# Patient Record
Sex: Male | Born: 1980 | Race: Black or African American | Hispanic: No | Marital: Single | Smoking: Former smoker
Health system: Southern US, Community
[De-identification: ages and names within clinical notes are randomized; demographics above are authoritative.]

## PROBLEM LIST (undated history)

## (undated) DIAGNOSIS — R739 Hyperglycemia, unspecified: Secondary | ICD-10-CM

## (undated) DIAGNOSIS — R079 Chest pain, unspecified: Secondary | ICD-10-CM

## (undated) DIAGNOSIS — E119 Type 2 diabetes mellitus without complications: Secondary | ICD-10-CM

## (undated) DIAGNOSIS — I10 Essential (primary) hypertension: Secondary | ICD-10-CM

## (undated) DIAGNOSIS — E785 Hyperlipidemia, unspecified: Secondary | ICD-10-CM

## (undated) HISTORY — DX: Hyperlipidemia, unspecified: E78.5

## (undated) HISTORY — DX: Chest pain, unspecified: R07.9

## (undated) HISTORY — DX: Essential (primary) hypertension: I10

## (undated) HISTORY — DX: Type 2 diabetes mellitus without complications: E11.9

## (undated) HISTORY — DX: Hyperglycemia, unspecified: R73.9

---

## 2019-09-08 ENCOUNTER — Encounter (INDEPENDENT_AMBULATORY_CARE_PROVIDER_SITE_OTHER): Payer: Self-pay | Admitting: Primary Care

## 2019-09-08 ENCOUNTER — Ambulatory Visit (INDEPENDENT_AMBULATORY_CARE_PROVIDER_SITE_OTHER): Payer: Self-pay | Admitting: Primary Care

## 2019-09-08 ENCOUNTER — Other Ambulatory Visit: Payer: Self-pay

## 2019-09-08 DIAGNOSIS — M79645 Pain in left finger(s): Secondary | ICD-10-CM

## 2019-09-08 DIAGNOSIS — Z720 Tobacco use: Secondary | ICD-10-CM

## 2019-09-08 DIAGNOSIS — G4733 Obstructive sleep apnea (adult) (pediatric): Secondary | ICD-10-CM

## 2019-09-08 DIAGNOSIS — Z7689 Persons encountering health services in other specified circumstances: Secondary | ICD-10-CM

## 2019-09-08 NOTE — Progress Notes (Signed)
Virtual Visit via Telephone Note  I connected with Tramane Gorum on 09/08/19 at  9:10 AM EDT by telephone and verified that I am speaking with the correct person using two identifiers.   I discussed the limitations, risks, security and privacy concerns of performing an evaluation and management service by telephone and the availability of in person appointments. I also discussed with the patient that there may be a patient responsible charge related to this service. The patient expressed understanding and agreed to proceed.   History of Present Illness: Mr. Steve Nelson is a tele visit to establish care. He complains of snoring and stop breathing during his sleep girl friend has to bulge him. Also, has pain in    No Past Medical History on File Observations/Objective: Review of Systems  Respiratory: Positive for shortness of breath.   Musculoskeletal:       Hand pain  Neurological: Positive for dizziness, tingling and headaches.    Assessment and Plan: Jovin was seen today for new patient (initial visit), snoring and dizziness.  Diagnoses and all orders for this visit:  Encounter to establish care Juluis Mire, NP-C will be your  (PCP)  Is a mastered prepared clincian that will provides health care and  continuing care of various  medical conditions, not limited by cause, organ system, or diagnosis.   OSA (obstructive sleep apnea) Patient states he has obstructive sleep apnea.  Patient generally gets 4-6 hours of interrupted sleep per night, and states they generally have poor sleep quality. Snoring of moderate  severity is present. Apneic episodes per girl friend  Is present.   Tobacco abuse Patient is made aware of each visit we will discuss cessation  Smoking increase risk for respiratory problems or lung disease.  Left index finger pain Denies injury or trauma will evaluate on in person exam to include labs for arthritis, carpal tunnel or trigger finger.      Follow Up Instructions:    I discussed the assessment and treatment plan with the patient. The patient was provided an opportunity to ask questions and all were answered. The patient agreed with the plan and demonstrated an understanding of the instructions.   The patient was advised to call back or seek an in-person evaluation if the symptoms worsen or if the condition fails to improve as anticipated.  I provided  minutes of  16 non-face-to-face time during this encounter.   Kerin Perna, NP

## 2019-09-13 ENCOUNTER — Other Ambulatory Visit: Payer: Self-pay

## 2019-09-13 ENCOUNTER — Ambulatory Visit (INDEPENDENT_AMBULATORY_CARE_PROVIDER_SITE_OTHER): Payer: Self-pay | Admitting: Primary Care

## 2019-09-13 ENCOUNTER — Encounter (INDEPENDENT_AMBULATORY_CARE_PROVIDER_SITE_OTHER): Payer: Self-pay | Admitting: Primary Care

## 2019-09-13 VITALS — BP 136/94 | HR 108 | Temp 98.6°F | Ht 66.0 in | Wt 210.4 lb

## 2019-09-13 DIAGNOSIS — Z0000 Encounter for general adult medical examination without abnormal findings: Secondary | ICD-10-CM

## 2019-09-13 DIAGNOSIS — R05 Cough: Secondary | ICD-10-CM

## 2019-09-13 DIAGNOSIS — Z131 Encounter for screening for diabetes mellitus: Secondary | ICD-10-CM

## 2019-09-13 DIAGNOSIS — Z23 Encounter for immunization: Secondary | ICD-10-CM

## 2019-09-13 DIAGNOSIS — G4733 Obstructive sleep apnea (adult) (pediatric): Secondary | ICD-10-CM

## 2019-09-13 DIAGNOSIS — R5383 Other fatigue: Secondary | ICD-10-CM

## 2019-09-13 DIAGNOSIS — M79645 Pain in left finger(s): Secondary | ICD-10-CM

## 2019-09-13 DIAGNOSIS — Z720 Tobacco use: Secondary | ICD-10-CM

## 2019-09-13 DIAGNOSIS — R030 Elevated blood-pressure reading, without diagnosis of hypertension: Secondary | ICD-10-CM

## 2019-09-13 LAB — POCT GLYCOSYLATED HEMOGLOBIN (HGB A1C): Hemoglobin A1C: 5.9 % — AB (ref 4.0–5.6)

## 2019-09-13 NOTE — Progress Notes (Signed)
slee  Established Patient Office Visit  Subjective:  Patient ID: Steve Nelson, male    DOB: 01/01/1981  Age: 38 y.o. MRN: [id number]  CC:  Chief Complaint  Patient presents with  . Annual Exam    HPI Steve Nelson presents for physical exam. Today blood pressure is slightly elevated. Also, before his appointment he smoked a cigarette. Complains of feeling tired sometimes this maybe related to working all the time but will rule out thyroid disease. Patient complains of left index finger continuing to hurt- he jammed it at work. States previously swollen, now just painful  No past medical history on file.  No past surgical history on file.  Family History  Problem Relation Age of Onset  . Hypertension Mother   . Diabetes Mother     Social History   Socioeconomic History  . Marital status: Single    Spouse name: Not on file  . Number of children: Not on file  . Years of education: Not on file  . Highest education level: Not on file  Occupational History  . Not on file  Social Needs  . Financial resource strain: Not on file  . Food insecurity    Worry: Not on file    Inability: Not on file  . Transportation needs    Medical: Not on file    Non-medical: Not on file  Tobacco Use  . Smoking status: Current Every Day Smoker    Types: Cigarettes  . Smokeless tobacco: Never Used  Substance and Sexual Activity  . Alcohol use: Not Currently  . Drug use: Never  . Sexual activity: Yes    Partners: Female    Birth control/protection: None  Lifestyle  . Physical activity    Days per week: Not on file    Minutes per session: Not on file  . Stress: Not on file  Relationships  . Social Herbalist on phone: Not on file    Gets together: Not on file    Attends religious service: Not on file    Active member of club or organization: Not on file    Attends meetings of clubs or organizations: Not on file    Relationship status: Not on file  . Intimate partner  violence    Fear of current or ex partner: Not on file    Emotionally abused: Not on file    Physically abused: Not on file    Forced sexual activity: Not on file  Other Topics Concern  . Not on file  Social History Narrative  . Not on file    No outpatient medications prior to visit.   No facility-administered medications prior to visit.     No Known Allergies  ROS Review of Systems  Constitutional: Positive for fatigue.  Respiratory: Positive for cough.        Smoker cough snoring  All other systems reviewed and are negative.     Objective:    Physical Exam  Constitutional: He is oriented to person, place, and time. He appears well-developed and well-nourished.  HENT:  Head: Normocephalic.  Eyes: Pupils are equal, round, and reactive to light. EOM are normal.  Neck: Normal range of motion. Neck supple.  Cardiovascular: Normal rate and regular rhythm.  Pulmonary/Chest: Effort normal and breath sounds normal.  Abdominal: Soft. Bowel sounds are normal. He exhibits distension.  Musculoskeletal: Normal range of motion.  Neurological: He is oriented to person, place, and time.  Skin: Skin is warm and dry.  Psychiatric: He has a normal mood and affect.    BP (!) 136/94 (BP Location: Left Arm, Patient Position: Sitting, Cuff Size: Large)   Pulse (!) 108   Temp 98.6 F (37 C) (Temporal)   Ht 5' 6"  (1.676 m)   Wt 210 lb 6.4 oz (95.4 kg)   SpO2 96%   BMI 33.96 kg/m  Wt Readings from Last 3 Encounters:  09/13/19 210 lb 6.4 oz (95.4 kg)     Health Maintenance Due  Topic Date Due  . HIV Screening  06/06/1995  . TETANUS/TDAP  06/06/1999   Steve Nelson was seen today for annual exam.  Diagnoses and all orders for this visit:  Annual physical exam -     Cancel: CBC with Differential -     Cancel: CMP14+EGFR -     Cancel: Lipid Panel -     Cancel: HIV Antibody (routine testing w rflx) -     CBC with Differential; Future -     CMP14+EGFR; Future -     HIV Antibody  (routine testing w rflx); Future -     Lipid Panel; Future  Tobacco abuse Patient is aware the risk of cigarette smoking but not at the point of stopping but at each encounter we will discuss cessation.  Elevated blood pressure reading without diagnosis of hypertension Target goal is  Less than 130/80. To achieve this goal discussed weight loss with 150 hours of exercising weekly , diet modification reduction in salty foods and snacks alternate fruit and vegetable and a healthy diet. Season foods with herbs or garlic or onion powder, and or Ms Deliah Boston   Fatigue, unspecified type-    Rule out underlying causative factor thyroid disease and anemia.   TSH + free T4; Future  OSA (obstructive sleep apnea)Patient presents with possible obstructive sleep apnea. Patent has a history of symptoms of OSA. Patient generally gets 4-6 hours of sleep per night, and states they generally have poor quality . Snoring of moderate severity ispresent. Apneic episodes unclear present.     Follow-up: No follow-ups on file.    Kerin Perna, NP

## 2019-09-13 NOTE — Patient Instructions (Signed)

## 2019-09-13 NOTE — Progress Notes (Signed)
Pt feels like something is in his ear  Pt complains of OSA and finger pain In left index finger

## 2019-10-18 ENCOUNTER — Other Ambulatory Visit: Payer: Self-pay

## 2019-10-18 ENCOUNTER — Ambulatory Visit (INDEPENDENT_AMBULATORY_CARE_PROVIDER_SITE_OTHER): Payer: Self-pay | Admitting: Primary Care

## 2019-10-18 ENCOUNTER — Encounter (INDEPENDENT_AMBULATORY_CARE_PROVIDER_SITE_OTHER): Payer: Self-pay | Admitting: Primary Care

## 2019-10-18 VITALS — BP 136/88 | HR 76 | Temp 97.3°F | Ht 66.0 in | Wt 210.4 lb

## 2019-10-18 DIAGNOSIS — E669 Obesity, unspecified: Secondary | ICD-10-CM

## 2019-10-18 DIAGNOSIS — Z6833 Body mass index (BMI) 33.0-33.9, adult: Secondary | ICD-10-CM

## 2019-10-18 DIAGNOSIS — Z0000 Encounter for general adult medical examination without abnormal findings: Secondary | ICD-10-CM

## 2019-10-18 DIAGNOSIS — Z720 Tobacco use: Secondary | ICD-10-CM

## 2019-10-18 DIAGNOSIS — R5383 Other fatigue: Secondary | ICD-10-CM

## 2019-10-18 DIAGNOSIS — I10 Essential (primary) hypertension: Secondary | ICD-10-CM

## 2019-10-18 MED ORDER — AMLODIPINE BESYLATE 5 MG PO TABS: 5.0000 mg | ORAL_TABLET | Freq: Every day | ORAL | 3 refills | Status: DC

## 2019-10-18 NOTE — Progress Notes (Signed)
Established Patient Office Visit  Subjective:  Patient ID: Steve Nelson, male    DOB: 04-24-1981  Age: 38 y.o. MRN: [id number]  CC:  Chief Complaint  Patient presents with  . Blood Pressure Check    HPI Steve Nelson presents for blood pressure re-evaluate to determine if medication is needed. Bp does follow guidelines for diagnosis of HTN. He denies shortness of breath, headaches, chest pain or lower extremity edema.  History reviewed. No pertinent past medical history.  History reviewed. No pertinent surgical history.  Family History  Problem Relation Age of Onset  . Hypertension Mother   . Diabetes Mother     Social History   Socioeconomic History  . Marital status: Single    Spouse name: Not on file  . Number of children: Not on file  . Years of education: Not on file  . Highest education level: Not on file  Occupational History  . Not on file  Social Needs  . Financial resource strain: Not on file  . Food insecurity    Worry: Not on file    Inability: Not on file  . Transportation needs    Medical: Not on file    Non-medical: Not on file  Tobacco Use  . Smoking status: Current Every Day Smoker    Types: Cigarettes  . Smokeless tobacco: Never Used  Substance and Sexual Activity  . Alcohol use: Not Currently  . Drug use: Never  . Sexual activity: Yes    Partners: Female    Birth control/protection: None  Lifestyle  . Physical activity    Days per week: Not on file    Minutes per session: Not on file  . Stress: Not on file  Relationships  . Social Musician on phone: Not on file    Gets together: Not on file    Attends religious service: Not on file    Active member of club or organization: Not on file    Attends meetings of clubs or organizations: Not on file    Relationship status: Not on file  . Intimate partner violence    Fear of current or ex partner: Not on file    Emotionally abused: Not on file    Physically abused:  Not on file    Forced sexual activity: Not on file  Other Topics Concern  . Not on file  Social History Narrative  . Not on file    No outpatient medications prior to visit.   No facility-administered medications prior to visit.     No Known Allergies  ROS Review of Systems  All other systems reviewed and are negative.     Objective:    Physical Exam  Constitutional: He is oriented to person, place, and time. He appears well-developed and well-nourished.  HENT:  Head: Normocephalic.  Neck: Neck supple.  Cardiovascular: Normal rate and regular rhythm.  Abdominal: Soft. Bowel sounds are normal. He exhibits distension.  Musculoskeletal: Normal range of motion.  Neurological: He is oriented to person, place, and time.  Psychiatric: He has a normal mood and affect. His behavior is normal.    BP 136/88 (BP Location: Left Arm, Patient Position: Sitting, Cuff Size: Large)   Pulse 76   Temp (!) 97.3 F (36.3 C) (Temporal)   Ht 5\' 6"  (1.676 m)   Wt 210 lb 6.4 oz (95.4 kg)   SpO2 96%   BMI 33.96 kg/m  Wt Readings from Last 3 Encounters:  10/18/19 210 lb  6.4 oz (95.4 kg)  09/13/19 210 lb 6.4 oz (95.4 kg)     There are no preventive care reminders to display for this patient.  There are no preventive care reminders to display for this patient.  Lab Results  Component Value Date   TSH 1.230 10/18/2019   Lab Results  Component Value Date   WBC 4.9 10/18/2019   HGB 15.3 10/18/2019   HCT 45.1 10/18/2019   MCV 85 10/18/2019   PLT 373 10/18/2019   Lab Results  Component Value Date   NA 138 10/18/2019   K 5.0 10/18/2019   CO2 21 10/18/2019   GLUCOSE 101 (H) 10/18/2019   BUN 13 10/18/2019   CREATININE 0.95 10/18/2019   BILITOT 0.3 10/18/2019   ALKPHOS 45 10/18/2019   AST 17 10/18/2019   ALT 19 10/18/2019   PROT 7.6 10/18/2019   ALBUMIN 4.8 10/18/2019   CALCIUM 9.8 10/18/2019   Lab Results  Component Value Date   CHOL 194 10/18/2019   Lab Results   Component Value Date   HDL 48 10/18/2019   Lab Results  Component Value Date   LDLCALC 137 (H) 10/18/2019   Lab Results  Component Value Date   TRIG 47 10/18/2019   Lab Results  Component Value Date   CHOLHDL 4.0 10/18/2019   Lab Results  Component Value Date   HGBA1C 5.9 (A) 09/13/2019      Assessment & Plan:  Steve Nelson was seen today for blood pressure check.  Diagnoses and all orders for this visit:  Tobacco abuse Discuss and patient understands nicotine affect every organ in the body , increased risk for lung cancer and other respiratory diseases recommend cessation.  This will be reminded at each clinical visit.  Essential hypertension New  Diagnosis of hypertension requiring Bp medication for controlled. Counseled on blood pressure goal of less than 130/80, low-sodium, DASH diet, medication compliance, 150 minutes of moderate intensity exercise per week. Discussed medication compliance, adverse effects.  BMI 33.0-33.9,adult Obesity is 30-39 indicating an excess in caloric intake or underlining conditions. This may lead to CVD, risk of diabetes, and tobacco abuse risk of heart attack. Lifestyle modifications of diet and exercise may reduce obesity.   Meds ordered this encounter  Medications  . amLODipine (NORVASC) 5 MG tablet    Sig: Take 1 tablet (5 mg total) by mouth daily.    Dispense:  30 tablet    Refill:  3    Follow-up: Return in about 4 weeks (around 11/15/2019) for Bp reading tele (4-6) weeks.    Steve Perna, NP

## 2019-10-18 NOTE — Patient Instructions (Signed)
Managing Your Hypertension Hypertension is commonly called high blood pressure. This is when the force of your blood pressing against the walls of your arteries is too strong. Arteries are blood vessels that carry blood from your heart throughout your body. Hypertension forces the heart to work harder to pump blood, and may cause the arteries to become narrow or stiff. Having untreated or uncontrolled hypertension can cause heart attack, stroke, kidney disease, and other problems. What are blood pressure readings? A blood pressure reading consists of a higher number over a lower number. Ideally, your blood pressure should be below 120/80. The first ("top") number is called the systolic pressure. It is a measure of the pressure in your arteries as your heart beats. The second ("bottom") number is called the diastolic pressure. It is a measure of the pressure in your arteries as the heart relaxes. What does my blood pressure reading mean? Blood pressure is classified into four stages. Based on your blood pressure reading, your health care provider may use the following stages to determine what type of treatment you need, if any. Systolic pressure and diastolic pressure are measured in a unit called mm Hg. Normal  Systolic pressure: below 120.  Diastolic pressure: below 80. Elevated  Systolic pressure: 120-129.  Diastolic pressure: below 80. Hypertension stage 1  Systolic pressure: 130-139.  Diastolic pressure: 80-89. Hypertension stage 2  Systolic pressure: 140 or above.  Diastolic pressure: 90 or above. What health risks are associated with hypertension? Managing your hypertension is an important responsibility. Uncontrolled hypertension can lead to:  A heart attack.  A stroke.  A weakened blood vessel (aneurysm).  Heart failure.  Kidney damage.  Eye damage.  Metabolic syndrome.  Memory and concentration problems. What changes can I make to manage my  hypertension? Hypertension can be managed by making lifestyle changes and possibly by taking medicines. Your health care provider will help you make a plan to bring your blood pressure within a normal range. Eating and drinking   Eat a diet that is high in fiber and potassium, and low in salt (sodium), added sugar, and fat. An example eating plan is called the DASH (Dietary Approaches to Stop Hypertension) diet. To eat this way: ? Eat plenty of fresh fruits and vegetables. Try to fill half of your plate at each meal with fruits and vegetables. ? Eat whole grains, such as whole wheat pasta, brown rice, or whole grain bread. Fill about one quarter of your plate with whole grains. ? Eat low-fat diary products. ? Avoid fatty cuts of meat, processed or cured meats, and poultry with skin. Fill about one quarter of your plate with lean proteins such as fish, chicken without skin, beans, eggs, and tofu. ? Avoid premade and processed foods. These tend to be higher in sodium, added sugar, and fat.  Reduce your daily sodium intake. Most people with hypertension should eat less than 1,500 mg of sodium a day.  Limit alcohol intake to no more than 1 drink a day for nonpregnant women and 2 drinks a day for men. One drink equals 12 oz of beer, 5 oz of wine, or 1 oz of hard liquor. Lifestyle  Work with your health care provider to maintain a healthy body weight, or to lose weight. Ask what an ideal weight is for you.  Get at least 30 minutes of exercise that causes your heart to beat faster (aerobic exercise) most days of the week. Activities may include walking, swimming, or biking.  Include exercise   to strengthen your muscles (resistance exercise), such as weight lifting, as part of your weekly exercise routine. Try to do these types of exercises for 30 minutes at least 3 days a week.  Do not use any products that contain nicotine or tobacco, such as cigarettes and e-cigarettes. If you need help quitting,  ask your health care provider.  Control any long-term (chronic) conditions you have, such as high cholesterol or diabetes. Monitoring  Monitor your blood pressure at home as told by your health care provider. Your personal target blood pressure may vary depending on your medical conditions, your age, and other factors.  Have your blood pressure checked regularly, as often as told by your health care provider. Working with your health care provider  Review all the medicines you take with your health care provider because there may be side effects or interactions.  Talk with your health care provider about your diet, exercise habits, and other lifestyle factors that may be contributing to hypertension.  Visit your health care provider regularly. Your health care provider can help you create and adjust your plan for managing hypertension. Will I need medicine to control my blood pressure? Your health care provider may prescribe medicine if lifestyle changes are not enough to get your blood pressure under control, and if:  Your systolic blood pressure is 130 or higher.  Your diastolic blood pressure is 80 or higher. Take medicines only as told by your health care provider. Follow the directions carefully. Blood pressure medicines must be taken as prescribed. The medicine does not work as well when you skip doses. Skipping doses also puts you at risk for problems. Contact a health care provider if:  You think you are having a reaction to medicines you have taken.  You have repeated (recurrent) headaches.  You feel dizzy.  You have swelling in your ankles.  You have trouble with your vision. Get help right away if:  You develop a severe headache or confusion.  You have unusual weakness or numbness, or you feel faint.  You have severe pain in your chest or abdomen.  You vomit repeatedly.  You have trouble breathing. Summary  Hypertension is when the force of blood pumping  through your arteries is too strong. If this condition is not controlled, it may put you at risk for serious complications.  Your personal target blood pressure may vary depending on your medical conditions, your age, and other factors. For most people, a normal blood pressure is less than 120/80.  Hypertension is managed by lifestyle changes, medicines, or both. Lifestyle changes include weight loss, eating a healthy, low-sodium diet, exercising more, and limiting alcohol. This information is not intended to replace advice given to you by your health care provider. Make sure you discuss any questions you have with your health care provider. Document Released: 08/05/2012 Document Revised: 03/05/2019 Document Reviewed: 10/09/2016 Elsevier Patient Education  2020 [employer]  

## 2019-10-19 LAB — CBC WITH DIFFERENTIAL/PLATELET
Basophils Absolute: 0.1 10*3/uL (ref 0.0–0.2)
Basos: 1 %
EOS (ABSOLUTE): 0.1 10*3/uL (ref 0.0–0.4)
Eos: 1 %
Hematocrit: 45.1 % (ref 37.5–51.0)
Hemoglobin: 15.3 g/dL (ref 13.0–17.7)
Immature Grans (Abs): 0 10*3/uL (ref 0.0–0.1)
Immature Granulocytes: 0 %
Lymphocytes Absolute: 1.4 10*3/uL (ref 0.7–3.1)
Lymphs: 28 %
MCH: 28.9 pg (ref 26.6–33.0)
MCHC: 33.9 g/dL (ref 31.5–35.7)
MCV: 85 fL (ref 79–97)
Monocytes Absolute: 0.4 10*3/uL (ref 0.1–0.9)
Monocytes: 9 %
Neutrophils Absolute: 3 10*3/uL (ref 1.4–7.0)
Neutrophils: 61 %
Platelets: 373 10*3/uL (ref 150–450)
RBC: 5.3 x10E6/uL (ref 4.14–5.80)
RDW: 13 % (ref 11.6–15.4)
WBC: 4.9 10*3/uL (ref 3.4–10.8)

## 2019-10-19 LAB — CMP14+EGFR
ALT: 19 IU/L (ref 0–44)
AST: 17 IU/L (ref 0–40)
Albumin/Globulin Ratio: 1.7 (ref 1.2–2.2)
Albumin: 4.8 g/dL (ref 4.0–5.0)
Alkaline Phosphatase: 45 IU/L (ref 39–117)
BUN/Creatinine Ratio: 14 (ref 9–20)
BUN: 13 mg/dL (ref 6–20)
Bilirubin Total: 0.3 mg/dL (ref 0.0–1.2)
CO2: 21 mmol/L (ref 20–29)
Calcium: 9.8 mg/dL (ref 8.7–10.2)
Chloride: 102 mmol/L (ref 96–106)
Creatinine, Ser: 0.95 mg/dL (ref 0.76–1.27)
GFR calc Af Amer: 116 mL/min/{1.73_m2} (ref >59)
GFR calc non Af Amer: 100 mL/min/{1.73_m2} (ref >59)
Globulin, Total: 2.8 g/dL (ref 1.5–4.5)
Glucose: 101 mg/dL — ABNORMAL HIGH (ref 65–99)
Potassium: 5 mmol/L (ref 3.5–5.2)
Sodium: 138 mmol/L (ref 134–144)
Total Protein: 7.6 g/dL (ref 6.0–8.5)

## 2019-10-19 LAB — LIPID PANEL
Chol/HDL Ratio: 4 ratio (ref 0.0–5.0)
Cholesterol, Total: 194 mg/dL (ref 100–199)
HDL: 48 mg/dL (ref >39)
LDL Chol Calc (NIH): 137 mg/dL — ABNORMAL HIGH (ref 0–99)
Triglycerides: 47 mg/dL (ref 0–149)
VLDL Cholesterol Cal: 9 mg/dL (ref 5–40)

## 2019-10-19 LAB — HIV ANTIBODY (ROUTINE TESTING W REFLEX): HIV Screen 4th Generation wRfx: NONREACTIVE

## 2019-10-19 LAB — TSH+FREE T4
Free T4: 1.01 ng/dL (ref 0.82–1.77)
TSH: 1.23 u[IU]/mL (ref 0.450–4.500)

## 2019-10-26 ENCOUNTER — Telehealth (INDEPENDENT_AMBULATORY_CARE_PROVIDER_SITE_OTHER): Payer: Self-pay

## 2019-10-26 NOTE — Telephone Encounter (Signed)
Patient verified date of birth. He is aware that all labs are normal except bad cholesterol, which can lead to stroke or heart attack. Advised patient to lower number he should decrease his fatty foods, red meat, cheese and milk. Increase fiber with whole grains and veggies or a fiber supplement such as Benefiber or Metamucil. Patient expressed understanding of results. Nat Christen, CMA

## 2019-10-26 NOTE — Telephone Encounter (Signed)
-----   Message from Kerin Perna, NP sent at 10/25/2019 10:54 AM EST ----- Labs normal except HDL-bad cholesterol that can lead to heart attack and stroke. To lower your number you can decrease your fatty foods, red meat, cheese, milk and increase fiber like whole grains and veggies. You can also add a fiber supplement like Metamucil or Benefiber.

## 2019-11-16 ENCOUNTER — Ambulatory Visit (INDEPENDENT_AMBULATORY_CARE_PROVIDER_SITE_OTHER): Payer: Self-pay | Admitting: Primary Care

## 2020-11-18 ENCOUNTER — Emergency Department (HOSPITAL_COMMUNITY): Payer: Self-pay

## 2020-11-18 ENCOUNTER — Emergency Department (HOSPITAL_COMMUNITY)
Admission: EM | Admit: 2020-11-18 | Discharge: 2020-11-18 | Disposition: A | Discharge status: Home / Self Care | Payer: Self-pay | Attending: Emergency Medicine | Admitting: Emergency Medicine

## 2020-11-18 DIAGNOSIS — I10 Essential (primary) hypertension: Secondary | ICD-10-CM | POA: Insufficient documentation

## 2020-11-18 DIAGNOSIS — R519 Headache, unspecified: Secondary | ICD-10-CM

## 2020-11-18 DIAGNOSIS — F17210 Nicotine dependence, cigarettes, uncomplicated: Secondary | ICD-10-CM | POA: Insufficient documentation

## 2020-11-18 DIAGNOSIS — R0789 Other chest pain: Secondary | ICD-10-CM | POA: Insufficient documentation

## 2020-11-18 LAB — CBC WITH DIFFERENTIAL/PLATELET
Abs Immature Granulocytes: 0.01 10*3/uL (ref 0.00–0.07)
Basophils Absolute: 0 10*3/uL (ref 0.0–0.1)
Basophils Relative: 1 %
Eosinophils Absolute: 0.1 10*3/uL (ref 0.0–0.5)
Eosinophils Relative: 1 %
HCT: 41 % (ref 39.0–52.0)
Hemoglobin: 13.7 g/dL (ref 13.0–17.0)
Immature Granulocytes: 0 %
Lymphocytes Relative: 28 %
Lymphs Abs: 1.2 10*3/uL (ref 0.7–4.0)
MCH: 30 pg (ref 26.0–34.0)
MCHC: 33.4 g/dL (ref 30.0–36.0)
MCV: 89.7 fL (ref 80.0–100.0)
Monocytes Absolute: 0.5 10*3/uL (ref 0.1–1.0)
Monocytes Relative: 11 %
Neutro Abs: 2.6 10*3/uL (ref 1.7–7.7)
Neutrophils Relative %: 59 %
Platelets: 317 10*3/uL (ref 150–400)
RBC: 4.57 MIL/uL (ref 4.22–5.81)
RDW: 14.1 % (ref 11.5–15.5)
WBC: 4.4 10*3/uL (ref 4.0–10.5)
nRBC: 0 % (ref 0.0–0.2)

## 2020-11-18 LAB — COMPREHENSIVE METABOLIC PANEL
ALT: 26 U/L (ref 0–44)
AST: 19 U/L (ref 15–41)
Albumin: 3.8 g/dL (ref 3.5–5.0)
Alkaline Phosphatase: 31 U/L — ABNORMAL LOW (ref 38–126)
Anion gap: 9 (ref 5–15)
BUN: 14 mg/dL (ref 6–20)
CO2: 23 mmol/L (ref 22–32)
Calcium: 8.7 mg/dL — ABNORMAL LOW (ref 8.9–10.3)
Chloride: 110 mmol/L (ref 98–111)
Creatinine, Ser: 1.16 mg/dL (ref 0.61–1.24)
GFR, Estimated: >60 mL/min (ref >60)
Glucose, Bld: 96 mg/dL (ref 70–99)
Potassium: 4.2 mmol/L (ref 3.5–5.1)
Sodium: 142 mmol/L (ref 135–145)
Total Bilirubin: 0.5 mg/dL (ref 0.3–1.2)
Total Protein: 6.8 g/dL (ref 6.5–8.1)

## 2020-11-18 MED ORDER — KETOROLAC TROMETHAMINE 15 MG/ML IJ SOLN
15.0000 mg | Freq: Once | INTRAMUSCULAR | Status: AC
  Administered 2020-11-18: 10:00:00 15 mg via INTRAVENOUS
  Filled 2020-11-18: qty 1

## 2020-11-18 MED ORDER — DIPHENHYDRAMINE HCL 50 MG/ML IJ SOLN
25.0000 mg | Freq: Once | INTRAMUSCULAR | Status: AC
  Administered 2020-11-18: 25 mg via INTRAVENOUS
  Filled 2020-11-18: qty 1

## 2020-11-18 MED ORDER — METOCLOPRAMIDE HCL 5 MG/ML IJ SOLN
10.0000 mg | Freq: Once | INTRAMUSCULAR | Status: AC
  Administered 2020-11-18: 10 mg via INTRAVENOUS
  Filled 2020-11-18: qty 2

## 2020-11-18 NOTE — ED Notes (Signed)
Pt transported to CT ?

## 2020-11-18 NOTE — Discharge Instructions (Addendum)
Your laboratory results were within normal limits today.  The CT of your head was normal.  Your blood pressure remained elevated while in the ED, you will need to establish care with a primary care physician in order to further manage your blood pressure.  If you experience any changes in vision, nausea, vomiting, worsening symptoms please return to the emergency room.

## 2020-11-18 NOTE — ED Notes (Signed)
Pt d/c home per MD order. Discharge summary reviewed with pt, pt verbalizes understanding. Ambulatory off unit. Discharge ride here.

## 2020-11-18 NOTE — ED Provider Notes (Addendum)
[facility] EMERGENCY DEPARTMENT Provider Note   CSN: [id number] Arrival date & time: 11/18/20  0557     History Chief Complaint  Patient presents with  . Headache  . Hypertension    Steve Nelson is a 39 y.o. male.  39 y.o male with a PMH of HTN presents to the ED with a chief complaint of headache x few weeks. Patient reports this have been ongoing feels like " I have migraines", states last night the headache felt worse than normal. Has been taking ibuprofen for pain but no improvement in his symptoms. No alleviating or exacerbating factors. He also endorses consuming alcohol last night. He also reports a brief episodes of central chest pressure, which has now resolved. Had taken advil which helped resolved. No changes in vision, no nausea, no vomiting, no weakness.  Of note patient has a hx of uncontrolled BP, he was placed on medication by PCP, states "I didn't think I needed it, like an idiot". Unsure how long BP has been elevated for. No prior hx of cardiac disease.   The history is provided by the patient.  Headache Pain location:  Generalized Quality:  Sharp Radiates to:  Does not radiate Onset quality:  Gradual Duration:  3 weeks Timing:  Constant Chronicity:  Recurrent Context: not exposure to bright light, not caffeine, not eating, not stress, not loud noise and not straining   Relieved by:  Nothing Associated symptoms: no abdominal pain, no fever, no nausea, no sore throat and no vomiting   Hypertension Associated symptoms include chest pain and headaches. Pertinent negatives include no abdominal pain and no shortness of breath.       No past medical history on file.  There are no problems to display for this patient.   No past surgical history on file.     Family History  Problem Relation Age of Onset  . Hypertension Mother   . Diabetes Mother     Social History   Tobacco Use  . Smoking status: Current Every Day Smoker     Types: Cigarettes  . Smokeless tobacco: Never Used  Substance Use Topics  . Alcohol use: Not Currently  . Drug use: Never    Home Medications Prior to Admission medications   Not on File    Allergies    Patient has no known allergies.  Review of Systems   Review of Systems  Constitutional: Negative for fever.  HENT: Negative for sore throat.   Respiratory: Negative for shortness of breath.   Cardiovascular: Positive for chest pain.  Gastrointestinal: Negative for abdominal pain, nausea and vomiting.  Genitourinary: Negative for flank pain.  Neurological: Positive for headaches. Negative for light-headedness.  All other systems reviewed and are negative.   Physical Exam Updated Vital Signs BP (!) 156/102   Pulse 72   Temp 98.1 F (36.7 C) (Oral)   Resp 16   Ht 5\' 7"  (1.702 m)   Wt 95.3 kg   SpO2 100%   BMI 32.89 kg/m   Physical Exam Vitals and nursing note reviewed.  Constitutional:      Appearance: He is well-developed.     Comments: Sleeping in stretcher comfortably.  HENT:     Head: Normocephalic and atraumatic.     Comments: No visible signs of trauma or injury.  Neck:     Comments: No meningeal signs.  Cardiovascular:     Rate and Rhythm: Normal rate.  Pulmonary:     Effort: Pulmonary effort is normal.  Abdominal:     Palpations: Abdomen is soft.     Tenderness: There is no abdominal tenderness.  Musculoskeletal:     Cervical back: Normal range of motion and neck supple.  Neurological:     Mental Status: He is alert.     GCS: GCS eye subscore is 4. GCS verbal subscore is 5. GCS motor subscore is 6.     Comments: Alert, oriented, thought content appropriate. Speech fluent without evidence of aphasia. Able to follow 2 step commands without difficulty.  Cranial Nerves:  II:  Peripheral visual fields grossly normal, pupils, round, reactive to light III,IV, VI: ptosis not present, extra-ocular motions intact bilaterally  V,VII: smile symmetric, facial  light touch sensation equal VIII: hearing grossly normal bilaterally  IX,X: midline uvula rise  XI: bilateral shoulder shrug equal and strong XII: midline tongue extension  Motor:  5/5 in upper and lower extremities bilaterally including strong and equal grip strength and dorsiflexion/plantar flexion Sensory: light touch normal in all extremities.  Cerebellar: normal finger-to-nose with bilateral upper extremities, pronator drift negative       ED Results / Procedures / Treatments   Labs (all labs ordered are listed, but only abnormal results are displayed) Labs Reviewed  COMPREHENSIVE METABOLIC PANEL - Abnormal; Notable for the following components:      Result Value   Calcium 8.7 (*)    Alkaline Phosphatase 31 (*)    All other components within normal limits  CBC WITH DIFFERENTIAL/PLATELET    EKG None  Radiology CT Head Wo Contrast  Result Date: 11/18/2020 CLINICAL DATA:  Posterior and left-sided headache. Elevated blood pressure EXAM: CT HEAD WITHOUT CONTRAST TECHNIQUE: Contiguous axial images were obtained from the base of the skull through the vertex without intravenous contrast. COMPARISON:  None. FINDINGS: Brain: No evidence of acute infarction, hemorrhage, hydrocephalus, extra-axial collection or mass lesion/mass effect. Vascular: No hyperdense vessel or unexpected calcification. Skull: Normal. Negative for fracture or focal lesion. Sinuses/Orbits: Paranasal sinuses and mastoid air cells are clear. Orbital structures within normal limits. Other: None. IMPRESSION: No acute intracranial findings. Electronically Signed   By: Duanne Guess D.O.   On: 11/18/2020 09:38    Procedures Procedures (including critical care time)  Medications Ordered in ED Medications  metoCLOPramide (REGLAN) injection 10 mg (10 mg Intravenous Given 11/18/20 0802)  diphenhydrAMINE (BENADRYL) injection 25 mg (25 mg Intravenous Given 11/18/20 0801)  ketorolac (TORADOL) 15 MG/ML injection 15 mg  (15 mg Intravenous Given 11/18/20 1022)    ED Course  I have reviewed the triage vital signs and the nursing notes.  Pertinent labs & imaging results that were available during my care of the patient were reviewed by me and considered in my medical decision making (see chart for details).    MDM Rules/Calculators/A&P   Patient with a long history of uncontrolled hypertension, currently not on any medication presents to the ED with a chief complaint of gradual onset headache for the past 3 weeks, has been taking ibuprofen and Advil for improvement in symptoms.  Felt like headache was worse yesterday, no nausea, vomiting, photophobia, thunderclap feeling.  Patient also endorses drinking alcohol while this episode occurred.  Had a short episode of central chest pain which has now resolved.  During primary evaluation patient is snoring on stretcher, woken up by me.  Neurologically intact without any facial asymmetry, dysarthria, gait is intact.  Blood pressure was reassessed by me with a systolic in the 160s over diastolic of 110.  Patient does report consuming  alcohol prior to arrival in the ED, for do not feel that history is completely accurate.  Will order screening labs along with CT head to further evaluate.  Interpretation of labs showed a CMP without any electrolyte abnormality.  Kidney function is without any signs of injury.  LFTs are unremarkable.  CBC is unremarkable.  She was provided with Reglan, Benadryl to help with headache.  CT head obtained, this showed No evidence of acute infarction, hemorrhage, hydrocephalus,  extra-axial collection or mass lesion/mass effect.   Patient provided with Toradol for pain control, plan on reassessment.  Patient is walking around the ED with a stable gait without any ataxia.  11:16 AM patient was reassessed after Toradol, does report improvement in his symptoms.  He is ambulatory in the ED with a steady gait, I do not suspect any intracranial  pathology at this time.  We did discuss appropriate blood pressure management with PCP follow-up.  He is agreeable of this at this time.  Return precautions discussed at length.   Portions of this note were generated with Scientist, clinical (histocompatibility and immunogenetics). Dictation errors may occur despite best attempts at proofreading.  Final Clinical Impression(s) / ED Diagnoses Final diagnoses:  Hypertension, unspecified type  Bad headache    Rx / DC Orders ED Discharge Orders    None       Claude Manges, PA-C 11/18/20 1116    Claude Manges, PA-C 11/18/20 1117    Tilden Fossa, MD 11/18/20 [phone]

## 2020-11-18 NOTE — ED Triage Notes (Signed)
Pt BIB EMS from home c/o pounding headache throughout head. Denies blurry vision, deficits, photophobia. Reports an episode of CP last night, 12/24 that lasted around 5 minutes  Hx HTN & headaches; noncompliant with HTN meds  EMS:  200/120 initially, later 180/120 HR 90, NSR CBG 126 RR 16, lung sounds clear 96% RA  18G IV L. AC

## 2020-11-20 ENCOUNTER — Ambulatory Visit (INDEPENDENT_AMBULATORY_CARE_PROVIDER_SITE_OTHER): Payer: Self-pay | Admitting: *Deleted

## 2020-11-20 NOTE — Telephone Encounter (Signed)
Patient calling with complaints of increased BP around 200/100, headache, and blurry vision that started on Saturday. Patient states that he experienced chest pain in the middle of his chest on Friday.No complaints of chest pain currently. Patient was seen in the ED on Saturday for symptoms. Patient states that he was prescribed BP medications previously but never went to pick them up. Patient previously scheduled for appt on 11/30/19. Attempted to call office to see if patient could be worked in for a sooner appt but no answer. Patient advised to return to ED if symptoms return or become worse before scheduled appt. Care advice given. Understanding verbalized. Patient can be contacted at [phone] if patient can be worked in for an earlier appt.  Reason for Disposition . [1] Systolic BP  >= 160 OR Diastolic >= 100 AND [2] cardiac or neurologic symptoms (e.g., chest pain, difficulty breathing, unsteady gait, blurred vision)  Answer Assessment - Initial Assessment Questions 1. BLOOD PRESSURE: "What is the blood pressure?" "Did you take at least two measurements 5 minutes apart?"     200/100 2. ONSET: "When did you take your blood pressure?"     Saturday after going to the ED 3. HOW: "How did you obtain the blood pressure?" (e.g., visiting nurse, automatic home BP monitor) Was taken at the ED  4. HISTORY: "Do you have a history of high blood pressure?" yes 5. MEDICATIONS: "Are you taking any medications for blood pressure?" "Have you missed any doses recently?"     No did not pick up medications prescribed previously, never went to pick them up 6. OTHER SYMPTOMS: "Do you have any symptoms?" (e.g., headache, chest pain, blurred vision, difficulty breathing, weakness)     Headache and blurry vision 7. PREGNANCY: "Is there any chance you are pregnant?" "When was your last menstrual period?"     n/a  Protocols used: BLOOD PRESSURE - HIGH-A-AH

## 2020-11-21 NOTE — Telephone Encounter (Signed)
Patient advised to return to UC. Still reports having a headache. No medications prescribed at ED. No available appointments any sooner than 11/30/19. He verbalized understanding.

## 2020-11-28 ENCOUNTER — Ambulatory Visit (INDEPENDENT_AMBULATORY_CARE_PROVIDER_SITE_OTHER): Payer: Self-pay

## 2020-11-28 NOTE — Telephone Encounter (Signed)
Patient called and says he has a headache and knows it's from his BP being up. He says he went to the ED a few weeks ago for this and his BP was over 200. He says he has an appointment tomorrow and wants to know why he can't come in the office to have his BP checked, why does it have to be a virtual visit. I looked and advised it says Office Visit, so that means he can go to the office. He denies any COVID symptoms now and when the appointment was made. He says now he has no headache because he's been taking some garlic pills and Tylenol. Care advice given, patient verbalized understanding.  Reason for Disposition . [1] MODERATE headache (e.g., interferes with normal activities) AND [2] present > 24 hours AND [3] unexplained  (Exceptions: analgesics not tried, typical migraine, or headache part of viral illness)  Answer Assessment - Initial Assessment Questions 1. LOCATION: "Where does it hurt?"      Entire head 2. ONSET: "When did the headache start?" (Minutes, hours or days)      Off and on for 2 months 3. PATTERN: "Does the pain come and go, or has it been constant since it started?"     Come and go 4. SEVERITY: "How bad is the pain?" and "What does it keep you from doing?"  (e.g., Scale 1-10; mild, moderate, or severe)   - MILD (1-3): doesn't interfere with normal activities    - MODERATE (4-7): interferes with normal activities or awakens from sleep    - SEVERE (8-10): excruciating pain, unable to do any normal activities        Not hurting now 5. RECURRENT SYMPTOM: "Have you ever had headaches before?" If Yes, ask: "When was the last time?" and "What happened that time?"      No 6. CAUSE: "What do you think is causing the headache?"     Hight blood pressure 7. MIGRAINE: "Have you been diagnosed with migraine headaches?" If Yes, ask: "Is this headache similar?"      No 8. HEAD INJURY: "Has there been any recent injury to the head?"      No 9. OTHER SYMPTOMS: "Do you have any other  symptoms?" (fever, stiff neck, eye pain, sore throat, cold symptoms)      No 10. PREGNANCY: "Is there any chance you are pregnant?" "When was your last menstrual period?"       N/A  Protocols used: HEADACHE-A-AH

## 2020-11-29 ENCOUNTER — Other Ambulatory Visit: Payer: Self-pay

## 2020-11-29 ENCOUNTER — Encounter (INDEPENDENT_AMBULATORY_CARE_PROVIDER_SITE_OTHER): Payer: Self-pay | Admitting: Primary Care

## 2020-11-29 ENCOUNTER — Telehealth (INDEPENDENT_AMBULATORY_CARE_PROVIDER_SITE_OTHER): Payer: Self-pay | Admitting: Primary Care

## 2020-11-29 DIAGNOSIS — Z720 Tobacco use: Secondary | ICD-10-CM

## 2020-11-29 DIAGNOSIS — F17200 Nicotine dependence, unspecified, uncomplicated: Secondary | ICD-10-CM

## 2020-11-29 DIAGNOSIS — I10 Essential (primary) hypertension: Secondary | ICD-10-CM

## 2020-11-29 MED ORDER — HYDROCHLOROTHIAZIDE 25 MG PO TABS: 25.0000 mg | ORAL_TABLET | Freq: Every day | ORAL | 3 refills | Status: DC

## 2020-11-29 MED ORDER — BUPROPION HCL ER (SR) 150 MG PO TB12: 150.0000 mg | ORAL_TABLET | Freq: Two times a day (BID) | ORAL | 1 refills | Status: DC

## 2020-11-29 MED ORDER — AMLODIPINE BESYLATE 10 MG PO TABS: 10.0000 mg | ORAL_TABLET | Freq: Every day | ORAL | 3 refills | Status: DC

## 2020-11-29 NOTE — Progress Notes (Signed)
I connected with  Steve Nelson on 11/29/20 by a video enabled telemedicine application and verified that I am speaking with the correct person using two identifiers.   I discussed the limitations of evaluation and management by telemedicine. The patient expressed understanding and agreed to proceed. [name], CMA    Pt does have BP monitor just needs to get batteries

## 2020-12-01 ENCOUNTER — Telehealth: Payer: Self-pay | Admitting: Licensed Clinical Social Worker

## 2020-12-01 NOTE — Telephone Encounter (Signed)
Call placed to patient regarding IBH referral. LCSW left message requesting a return call.  

## 2020-12-04 ENCOUNTER — Ambulatory Visit (INDEPENDENT_AMBULATORY_CARE_PROVIDER_SITE_OTHER): Payer: Self-pay | Admitting: *Deleted

## 2020-12-04 NOTE — Telephone Encounter (Signed)
He started feeling bad the day after Christmas.  He called on Christmas Eve and was referred to the ED where they found out he had high BP.  He is c/o an  uneasy feeling.   It's an underlying nervousness I'm having.    I'm sluggish and dizzy too.   He was sneezing and blowing his nose during the call.   I questioned about those symptoms and he sounds like he could potentially have covid symptoms.   I encouraged him to be tested for covid which he said he would do.   "[place]  I encouraged him to go today and went over the quarantine protocol. I'm taking Nyquil at night.  I've almost taken the whole bottle.   I got it last week when I got my prescriptions.  I've almost taken the whole bottle because I felt so bad.   I let him know the medication in Nyquil could be making him have the underlying nervous feeling too.   That medication can also elevated his BP.  He c/o nasal congestion, sneezing, headache, sore throat, fatigue, dizziness.  He started feeling bad the day after Christmas.  I have sent a note to Gwinda Passe, NP.   He can be reached at [phone].      Reason for Disposition . [1] Caller has URGENT medicine question about med that PCP or specialist prescribed AND [2] triager unable to answer question  Answer Assessment - Initial Assessment Questions 1. NAME of MEDICATION: "What medicine are you calling about?"     My BP pills.   I have 3 of them.   Do I take them all at the same time?   I feel sluggish and dizzy.   He also has nasal congestion.  My voice is hoarse  2. QUESTION: "What is your question?" (e.g., medication refill, side effect)     Should I take all 3 at same time.      I went to the hospital over Christmas because my BP was really high.    3. PRESCRIBING HCP: "Who prescribed it?" Reason: if prescribed by specialist, call should be referred to that group.     These all 3 are new.   Gwinda Passe, NP prescribed 4. SYMPTOMS:  "Do you have any symptoms?"     Tired, sluggish, nasal congestion, dizzy when I'm up walking around.   5. SEVERITY: If symptoms are present, ask "Are they mild, moderate or severe?"     Moderate. He started feeling bad last week sometime.   6. PREGNANCY:  "Is there any chance that you are pregnant?" "When was your last menstrual period?"     N/A  Protocols used: MEDICATION QUESTION CALL-A-AH

## 2020-12-05 NOTE — Telephone Encounter (Signed)
Sent as FYI. 

## 2020-12-05 NOTE — Progress Notes (Signed)
Telephone Note  I connected with Steve Nelson on 12/05/20 at  3:30 PM EST by telephone and verified that I am speaking with the correct person using two identifiers.  Location: Patient: home Provider: Grayce Sessions working from home   I discussed the limitations, risks, security and privacy concerns of performing an evaluation and management service by telephone and the availability of in person appointments. I also discussed with the patient that there may be a patient responsible charge related to this service. The patient expressed understanding and agreed to proceed.   History of Present Illness: Mr. Steve Nelson is a 40 year old male having a f/u for  follow up from the emergency room for hypertension and head ache. Denies shortness of breath, headaches, chest pain or lower extremity edema. HEADACHES RESOLVES   History reviewed. No pertinent past medical history.   No Known Allergies    No current outpatient medications on file prior to visit.   No current facility-administered medications on file prior to visit.    ROS: all negative except above.  Observations/Objective: Pertinent positive and negative listed    Assessment and Plan: Tandre was seen today for hospitalization follow-up.  Diagnoses and all orders for this visit:  Tobacco abuse -     buPROPion (WELLBUTRIN SR) 150 MG 12 hr tablet; Take 1 tablet (150 mg total) by mouth 2 (two) times daily.  Essential hypertension Blood pressure goal of less than 130/80, low-sodium, DASH diet, medication compliance, 150 minutes of moderate intensity exercise per week. Discussed medication compliance, adverse effects.  Tobacco dependency -     Referral to smoking cessation program  Other orders -     amLODipine (NORVASC) 10 MG tablet; Take 1 tablet (10 mg total) by mouth daily. -     hydrochlorothiazide (HYDRODIURIL) 25 MG tablet; Take 1 tablet (25 mg total) by mouth daily.    Follow Up Instructions:     I discussed the assessment and treatment plan with the patient. The patient was provided an opportunity to ask questions and all were answered. The patient agreed with the plan and demonstrated an understanding of the instructions.   The patient was advised to call back or seek an in-person evaluation if the symptoms worsen or if the condition fails to improve as anticipated.  I provided 20 minutes of non-face-to-face time during this encounter.   Grayce Sessions, NP

## 2020-12-11 ENCOUNTER — Telehealth: Payer: Self-pay | Admitting: Licensed Clinical Social Worker

## 2020-12-11 NOTE — Telephone Encounter (Signed)
Call placed to patient. LCSW introduced self and explained role at [facility]. Pt informed patient of IBH referral to address behavioral health and/or resource needs.   Pt shared that he is not experiencing any mental health concerns at this time. Shared he is taking both blood pressure medications; however, discontinued with Wellbutrin due to "feeling funny"   Pt shared that he is currently uninsured and is concerned about affordability of medications. LCSW discussed various departments, including, Financial Counseling, which can provide assistance with incoming medical bills and costs of medications. Pt was very appreciative and requested Financial Counseling application be sent to verified address on file.   Pt is requesting documentation of recent negative COVID19 test Administered at the [facility]) to provide to his employer. Message has been routed to PCP to advise.   No additional concerns noted.

## 2021-07-04 ENCOUNTER — Emergency Department (HOSPITAL_BASED_OUTPATIENT_CLINIC_OR_DEPARTMENT_OTHER)
Admission: EM | Admit: 2021-07-04 | Discharge: 2021-07-04 | Disposition: A | Discharge status: Home / Self Care | Payer: 59 | Attending: Emergency Medicine | Admitting: Emergency Medicine

## 2021-07-04 ENCOUNTER — Encounter (HOSPITAL_BASED_OUTPATIENT_CLINIC_OR_DEPARTMENT_OTHER): Payer: Self-pay | Admitting: *Deleted

## 2021-07-04 ENCOUNTER — Other Ambulatory Visit: Payer: Self-pay

## 2021-07-04 ENCOUNTER — Emergency Department (HOSPITAL_BASED_OUTPATIENT_CLINIC_OR_DEPARTMENT_OTHER): Payer: 59 | Admitting: Radiology

## 2021-07-04 DIAGNOSIS — J029 Acute pharyngitis, unspecified: Secondary | ICD-10-CM | POA: Diagnosis present

## 2021-07-04 DIAGNOSIS — G8929 Other chronic pain: Secondary | ICD-10-CM | POA: Insufficient documentation

## 2021-07-04 DIAGNOSIS — R5383 Other fatigue: Secondary | ICD-10-CM | POA: Insufficient documentation

## 2021-07-04 DIAGNOSIS — M25511 Pain in right shoulder: Secondary | ICD-10-CM | POA: Diagnosis not present

## 2021-07-04 DIAGNOSIS — Z20822 Contact with and (suspected) exposure to covid-19: Secondary | ICD-10-CM | POA: Diagnosis not present

## 2021-07-04 DIAGNOSIS — F17210 Nicotine dependence, cigarettes, uncomplicated: Secondary | ICD-10-CM | POA: Diagnosis not present

## 2021-07-04 DIAGNOSIS — J069 Acute upper respiratory infection, unspecified: Secondary | ICD-10-CM | POA: Insufficient documentation

## 2021-07-04 DIAGNOSIS — B309 Viral conjunctivitis, unspecified: Secondary | ICD-10-CM | POA: Diagnosis not present

## 2021-07-04 LAB — RESP PANEL BY RT-PCR (FLU A&B, COVID) ARPGX2
Influenza A by PCR: NEGATIVE
Influenza B by PCR: NEGATIVE
SARS Coronavirus 2 by RT PCR: NEGATIVE

## 2021-07-04 NOTE — Discharge Instructions (Addendum)
You were evaluated at [facility] ED for upper respiratory viral illness. These usually self resolve in a week. Please continue conservative management and using over the counter medications for symptoms as needed.   Your shoulder x-ray did not show any acute bone abnormalities, fractures, dislocations. You will need to see a primary care physician or orthopedics for shoulder pain for continued workup. Phone numbers provided in your paperwork.   Your covid test is still in process at this time. You will receive a call with results, alternatively you can sign up for mychart patient portal and see your results online.  Thank you for allowing [place] to be part of your care.

## 2021-07-04 NOTE — ED Triage Notes (Signed)
Right shoulder pain for 2 weeks and sore throat since last week.  Home test for covid done yesterday, negative.

## 2021-07-04 NOTE — ED Provider Notes (Signed)
[facility] EMERGENCY DEPT Provider Note   CSN: [id number] Arrival date & time: 07/04/21  1131     History Chief Complaint  Patient presents with   Sore Throat   Shoulder Pain    Steve Nelson is a 40 y.o. male with no significant PMHx who presents to [facility] ED for sore throat and shoulder pain. Patient states sore throat and cough started over the weekend and have persisted which prompted him to seek medical attention. He states he went to urgent care yesterday and was told to come to the emergency department for a COVID test, despite patient having done an at home test and testing negative. States he has taken a few tests and tested negative since this weekend. He has tried tylenol at home for symptoms without improvement. He has also noticed painless redness of his left eye and some clear drainage this morning. His other concern is his shoulder pain. 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 The pain is intermittent, he has not lost range of motion. He has "torn ligaments" in L shoulder in the past.   The history is provided by the patient.  Sore Throat Pertinent negatives include no chest pain, no abdominal pain and no shortness of breath.  Shoulder Pain Associated symptoms: fatigue   Associated symptoms: no fever       History reviewed. No pertinent past medical history.  There are no problems to display for this patient.   History reviewed. No pertinent surgical history.     Family History  Problem Relation Age of Onset   Hypertension Mother    Diabetes Mother     Social History   Tobacco Use   Smoking status: Every Day    Types: Cigarettes   Smokeless tobacco: Never  Vaping Use   Vaping Use: Never used  Substance Use Topics   Alcohol use: Yes    Comment: occasionally   Drug use: Never    Home Medications Prior to Admission medications   Medication Sig Start Date End  Date Taking? Authorizing Provider  amLODipine (NORVASC) 10 MG tablet Take 1 tablet (10 mg total) by mouth daily. 11/29/20  Yes Grayce Sessions, NP  hydrochlorothiazide (HYDRODIURIL) 25 MG tablet Take 1 tablet (25 mg total) by mouth daily. 11/29/20  Yes Grayce Sessions, NP  buPROPion (WELLBUTRIN SR) 150 MG 12 hr tablet Take 1 tablet (150 mg total) by mouth 2 (two) times daily. 11/29/20   Grayce Sessions, NP    Allergies    Patient has no known allergies.  Review of Systems   Review of Systems  Constitutional:  Positive for fatigue. Negative for fever.  HENT:  Positive for sore throat and voice change. Negative for congestion and rhinorrhea.   Eyes:  Positive for discharge and redness. Negative for pain.  Respiratory:  Positive for cough. Negative for shortness of breath.   Cardiovascular:  Negative for chest pain.  Gastrointestinal:  Negative for abdominal pain, constipation, diarrhea, nausea and vomiting.  Musculoskeletal:  Negative for myalgias.  Neurological:  Negative for weakness.   Physical Exam Updated Vital Signs BP 138/76 (BP Location: Right Arm)   Pulse 86   Temp 98.6 F (37 C) (Oral)   Resp 15   Ht 5\' 7"  (1.702 m)   Wt 90.7 kg   SpO2 96%   BMI 31.32 kg/m   Physical Exam Constitutional:      General: He  is not in acute distress.    Appearance: He is well-developed and normal weight. He is not ill-appearing.  HENT:     Head: Normocephalic and atraumatic.     Mouth/Throat:     Mouth: Mucous membranes are moist. No oral lesions.     Pharynx: Posterior oropharyngeal erythema present. No oropharyngeal exudate.     Tonsils: No tonsillar exudate.  Eyes:     General:        Left eye: Discharge present.    Conjunctiva/sclera:     Left eye: Left conjunctiva is injected.  Cardiovascular:     Rate and Rhythm: Normal rate and regular rhythm.     Pulses: Normal pulses.     Heart sounds: No murmur heard.   No friction rub. No gallop.  Pulmonary:     Effort:  Pulmonary effort is normal. No respiratory distress.     Breath sounds: Normal breath sounds. No wheezing, rhonchi or rales.  Abdominal:     General: Abdomen is flat. Bowel sounds are normal. There is no distension.     Palpations: Abdomen is soft.     Tenderness: There is no abdominal tenderness. There is no guarding.  Musculoskeletal:     Right lower leg: No edema.     Left lower leg: No edema.  Skin:    General: Skin is warm and dry.  Neurological:     General: No focal deficit present.     Mental Status: He is alert and oriented to person, place, and time. Mental status is at baseline.  Psychiatric:        Mood and Affect: Mood normal.        Behavior: Behavior normal.    ED Results / Procedures / Treatments   Labs (all labs ordered are listed, but only abnormal results are displayed) Labs Reviewed  RESP PANEL BY RT-PCR (FLU A&B, COVID) [id number]    EKG None  Radiology DG Chest 1 View  Result Date: 07/04/2021 CLINICAL DATA:  Cough EXAM: CHEST  1 VIEW COMPARISON:  None. FINDINGS: Cardiac and mediastinal contours are within normal limits for AP technique. Normal mediastinal contours. Both lungs are clear. The visualized skeletal structures are unremarkable. IMPRESSION: No active disease. Electronically Signed   By: Allegra Lai MD   On: 07/04/2021 13:07   DG Shoulder 1 View Right  Result Date: 07/04/2021 CLINICAL DATA:  Shoulder pain EXAM: RIGHT SHOULDER - 1 VIEW COMPARISON:  None. FINDINGS: There is no evidence of fracture or dislocation. There is no evidence of arthropathy or other focal bone abnormality. Soft tissues are unremarkable. IMPRESSION: Negative. Electronically Signed   By: Allegra Lai MD   On: 07/04/2021 13:08    Procedures Procedures No procedures performed this ED visit.  Medications Ordered in ED Medications - No data to display  ED Course  I have reviewed the triage vital signs and the nursing notes.  Pertinent labs & imaging results that were  available during my care of the patient were reviewed by me and considered in my medical decision making (see chart for details).    MDM Rules/Calculators/A&P                         Ibrahima Holberg is a 40 y.o. male with no significant PMHx who presents to [facility] ED for sore throat and shoulder pain. Patient is afebrile and HDS in the ED. On exam patient has injected left conjunctiva and erythematous oropharynx. Centor score 1  with low likelihood for strep throat. Likely patient has upper respiratory viral illness causing his left eye conjunctivitis, pharyngitis, cough. CXR performed and negative for active cardiopulmonary disease. COVID retested while in ED and results are pending. Xray shoulder without evidence of fracture, dislocation, arthropathy, other focal bone abnormality. Patient is stable for discharge. He will need to follow up with primary care or orthopedics for chronic shoulder pain. He will need to continue conservative management of URI at home. Patient counseled that URI will self resolve. Patient counseled that someone will call with COVID results, alternatively he can set up Mychart for results.    Final Clinical Impression(s) / ED Diagnoses Final diagnoses:  Viral upper respiratory illness  Viral conjunctivitis of left eye  Chronic right shoulder pain    Rx / DC Orders ED Discharge Orders     None        Ellison Carwin, MD 07/04/21 1351    Milagros Loll, MD 07/05/21 [phone]

## 2021-11-17 ENCOUNTER — Other Ambulatory Visit (INDEPENDENT_AMBULATORY_CARE_PROVIDER_SITE_OTHER): Payer: Self-pay | Admitting: Primary Care

## 2021-11-17 NOTE — Telephone Encounter (Signed)
Requested Prescriptions  Pending Prescriptions Disp Refills   hydrochlorothiazide (HYDRODIURIL) 25 MG tablet [Pharmacy Med Name: HYDROCHLOROTHIAZIDE 25MG  TABLETS] 90 tablet 3    Sig: TAKE 1 TABLET(25 MG) BY MOUTH DAILY     Cardiovascular: Diuretics - Thiazide Failed - 11/17/2021 10:32 AM      Failed - Ca in normal range and within 360 days    Calcium  Date Value Ref Range Status  11/18/2020 8.7 (L) 8.9 - 10.3 mg/dL Final         Failed - Cr in normal range and within 360 days    Creatinine, Ser  Date Value Ref Range Status  11/18/2020 1.16 0.61 - 1.24 mg/dL Final         Failed - K in normal range and within 360 days    Potassium  Date Value Ref Range Status  11/18/2020 4.2 3.5 - 5.1 mmol/L Final         Failed - Na in normal range and within 360 days    Sodium  Date Value Ref Range Status  11/18/2020 142 135 - 145 mmol/L Final  10/18/2019 138 134 - 144 mmol/L Final         Failed - Valid encounter within last 6 months    Recent Outpatient Visits          11 months ago Tobacco abuse   [facility] 10/20/2019, NP   2 years ago Tobacco abuse   [facility], NP   2 years ago Annual physical exam   [facility], NP   2 years ago Encounter to establish care   [facility], NP      Future Appointments            In 2 weeks Grayce Sessions, NP [facility]           Passed - Last BP in normal range    BP Readings from Last 1 Encounters:  07/04/21 136/86          amLODipine (NORVASC) 10 MG tablet [Pharmacy Med Name: AMLODIPINE BESYLATE 10MG  TABLETS] 18 tablet 3    Sig: TAKE 1 TABLET(10 MG) BY MOUTH DAILY     Cardiovascular:  Calcium Channel Blockers Failed - 11/17/2021 10:32 AM      Failed - Valid encounter within last 6 months    Recent Outpatient Visits          11 months ago  Tobacco abuse   [facility] , NP   2 years ago Tobacco abuse   [facility] Grayce Sessions, NP   2 years ago Annual physical exam   [facility] Grayce Sessions, NP   2 years ago Encounter to establish care   [facility] Grayce Sessions, NP      Future Appointments            In 2 weeks [facility], NP [facility]           Passed - Last BP in normal range    BP Readings from Last 1 Encounters:  07/04/21 136/86          Called pt and upcoming app made.

## 2021-12-04 ENCOUNTER — Ambulatory Visit (INDEPENDENT_AMBULATORY_CARE_PROVIDER_SITE_OTHER): Payer: 59 | Admitting: Primary Care

## 2021-12-10 ENCOUNTER — Other Ambulatory Visit (INDEPENDENT_AMBULATORY_CARE_PROVIDER_SITE_OTHER): Payer: Self-pay | Admitting: Primary Care

## 2021-12-10 NOTE — Telephone Encounter (Signed)
Medication Refill - Medication: amLODipine (NORVASC) 10 MG tablet hydrochlorothiazide (HYDRODIURIL) 25 MG tablet  Has the patient contacted their pharmacy? Yes.   ( Preferred Pharmacy (with phone number or street name): [facility] [id number] - [place], [address] AT [address] Has the patient been seen for an appointment in the last year OR does the patient have an upcoming appointment? Yes.   Pt states he has "about 5 pills" and asking for enough to get him through to appt

## 2021-12-10 NOTE — Telephone Encounter (Signed)
Requested medication (s) are due for refill today   Yes  Requested medication (s) are on the active medication list   Yes  Future visit scheduled Yes for 12/17/21  Note to clinic-LOV 11/29/20.  Courtesy 18 day refill provided on 11/17/21 to last until upcoming 12/17/21 appointment. Due to courtesy refill provided already, we must forward to provider.   Requested Prescriptions  Pending Prescriptions Disp Refills   amLODipine (NORVASC) 10 MG tablet 18 tablet 3     Cardiovascular:  Calcium Channel Blockers Failed - 12/10/2021 10:46 AM      Failed - Valid encounter within last 6 months    Recent Outpatient Visits           1 year ago Tobacco abuse   [facility] Grayce Sessions, NP   2 years ago Tobacco abuse   [facility] Grayce Sessions, NP   2 years ago Annual physical exam   [facility] Grayce Sessions, NP   2 years ago Encounter to establish care   [facility] Grayce Sessions, NP       Future Appointments             In 1 week Grayce Sessions, NP [facility]            Passed - Last BP in normal range    BP Readings from Last 1 Encounters:  07/04/21 136/86           hydrochlorothiazide (HYDRODIURIL) 25 MG tablet 18 tablet 0     Cardiovascular: Diuretics - Thiazide Failed - 12/10/2021 10:46 AM      Failed - Ca in normal range and within 360 days    Calcium  Date Value Ref Range Status  11/18/2020 8.7 (L) 8.9 - 10.3 mg/dL Final          Failed - Cr in normal range and within 360 days    Creatinine, Ser  Date Value Ref Range Status  11/18/2020 1.16 0.61 - 1.24 mg/dL Final          Failed - K in normal range and within 360 days    Potassium  Date Value Ref Range Status  11/18/2020 4.2 3.5 - 5.1 mmol/L Final          Failed - Na in normal range and within 360 days    Sodium  Date Value Ref Range Status  11/18/2020 142 135  - 145 mmol/L Final  10/18/2019 138 134 - 144 mmol/L Final          Failed - Valid encounter within last 6 months    Recent Outpatient Visits           1 year ago Tobacco abuse   [facility] Grayce Sessions, NP   2 years ago Tobacco abuse   [facility] Grayce Sessions, NP   2 years ago Annual physical exam   [facility] Grayce Sessions, NP   2 years ago Encounter to establish care   [facility] Grayce Sessions, NP       Future Appointments             In 1 week Grayce Sessions, NP [facility]            Passed - Last BP in normal range  BP Readings from Last 1 Encounters:  07/04/21 136/86

## 2021-12-15 ENCOUNTER — Other Ambulatory Visit (INDEPENDENT_AMBULATORY_CARE_PROVIDER_SITE_OTHER): Payer: Self-pay | Admitting: Primary Care

## 2021-12-15 MED ORDER — HYDROCHLOROTHIAZIDE 25 MG PO TABS: ORAL_TABLET | ORAL | 0 refills | Status: DC

## 2021-12-15 NOTE — Addendum Note (Signed)
Addended by: Garrison Columbus on: 12/15/2021 04:02 PM   Modules accepted: Orders

## 2021-12-15 NOTE — Telephone Encounter (Signed)
Requested Prescriptions  Pending Prescriptions Disp Refills   hydrochlorothiazide (HYDRODIURIL) 25 MG tablet [Pharmacy Med Name: HYDROCHLOROTHIAZIDE 25MG  TABLETS] 3 tablet 0    Sig: TAKE 1 TABLET(25 MG) BY MOUTH DAILY     Cardiovascular: Diuretics - Thiazide Failed - 12/15/2021  1:41 PM      Failed - Ca in normal range and within 360 days    Calcium  Date Value Ref Range Status  11/18/2020 8.7 (L) 8.9 - 10.3 mg/dL Final         Failed - Cr in normal range and within 360 days    Creatinine, Ser  Date Value Ref Range Status  11/18/2020 1.16 0.61 - 1.24 mg/dL Final         Failed - K in normal range and within 360 days    Potassium  Date Value Ref Range Status  11/18/2020 4.2 3.5 - 5.1 mmol/L Final         Failed - Na in normal range and within 360 days    Sodium  Date Value Ref Range Status  11/18/2020 142 135 - 145 mmol/L Final  10/18/2019 138 134 - 144 mmol/L Final         Failed - Valid encounter within last 6 months    Recent Outpatient Visits          1 year ago Tobacco abuse   [place], Michelle P, NP   2 years ago Tobacco abuse   [place] Kerin Perna, NP   2 years ago Annual physical exam   [place] Kerin Perna, NP   2 years ago Encounter to establish care   [place], [place], NP      Future Appointments            In 2 days Kerin Perna, NP [place] - Last BP in normal range    BP Readings from Last 1 Encounters:  07/04/21 136/86

## 2021-12-16 ENCOUNTER — Other Ambulatory Visit (INDEPENDENT_AMBULATORY_CARE_PROVIDER_SITE_OTHER): Payer: Self-pay | Admitting: Primary Care

## 2021-12-16 NOTE — Telephone Encounter (Signed)
last RF 12/15/21 #3 tablets -- no further refills until seen in office.  Requested Prescriptions  Refused Prescriptions Disp Refills   hydrochlorothiazide (HYDRODIURIL) 25 MG tablet [Pharmacy Med Name: HYDROCHLOROTHIAZIDE 25MG  TABLETS] 3 tablet 0    Sig: TAKE 1 TABLET BY MOUTH DAILY     Cardiovascular: Diuretics - Thiazide Failed - 12/16/2021  2:26 PM      Failed - Ca in normal range and within 360 days    Calcium  Date Value Ref Range Status  11/18/2020 8.7 (L) 8.9 - 10.3 mg/dL Final         Failed - Cr in normal range and within 360 days    Creatinine, Ser  Date Value Ref Range Status  11/18/2020 1.16 0.61 - 1.24 mg/dL Final         Failed - K in normal range and within 360 days    Potassium  Date Value Ref Range Status  11/18/2020 4.2 3.5 - 5.1 mmol/L Final         Failed - Na in normal range and within 360 days    Sodium  Date Value Ref Range Status  11/18/2020 142 135 - 145 mmol/L Final  10/18/2019 138 134 - 144 mmol/L Final         Failed - Valid encounter within last 6 months    Recent Outpatient Visits          1 year ago Tobacco abuse   [place], Michelle P, NP   2 years ago Tobacco abuse   [place] Kerin Perna, NP   2 years ago Annual physical exam   [place] Kerin Perna, NP   2 years ago Encounter to establish care   [place], [place], NP      Future Appointments            Tomorrow Kerin Perna, NP [facility]           Passed - Last BP in normal range    BP Readings from Last 1 Encounters:  07/04/21 136/86

## 2021-12-17 ENCOUNTER — Ambulatory Visit (INDEPENDENT_AMBULATORY_CARE_PROVIDER_SITE_OTHER): Payer: 59 | Admitting: Primary Care

## 2021-12-17 ENCOUNTER — Other Ambulatory Visit: Payer: Self-pay

## 2021-12-17 ENCOUNTER — Encounter (INDEPENDENT_AMBULATORY_CARE_PROVIDER_SITE_OTHER): Payer: Self-pay | Admitting: Primary Care

## 2021-12-17 VITALS — BP 136/97 | HR 104 | Temp 98.7°F | Ht 67.0 in | Wt 225.6 lb

## 2021-12-17 DIAGNOSIS — E119 Type 2 diabetes mellitus without complications: Secondary | ICD-10-CM

## 2021-12-17 DIAGNOSIS — Z720 Tobacco use: Secondary | ICD-10-CM

## 2021-12-17 DIAGNOSIS — Z131 Encounter for screening for diabetes mellitus: Secondary | ICD-10-CM

## 2021-12-17 DIAGNOSIS — I10 Essential (primary) hypertension: Secondary | ICD-10-CM | POA: Diagnosis not present

## 2021-12-17 DIAGNOSIS — M25561 Pain in right knee: Secondary | ICD-10-CM

## 2021-12-17 LAB — POCT GLYCOSYLATED HEMOGLOBIN (HGB A1C): Hemoglobin A1C: 6.7 % — AB (ref 4.0–5.6)

## 2021-12-17 MED ORDER — NAPROXEN 500 MG PO TABS: 500.0000 mg | ORAL_TABLET | Freq: Two times a day (BID) | ORAL | 0 refills | Status: DC

## 2021-12-17 MED ORDER — METFORMIN HCL ER 500 MG PO TB24: 500.0000 mg | ORAL_TABLET | Freq: Every day | ORAL | 1 refills | Status: DC

## 2021-12-17 MED ORDER — NICOTINE 14 MG/24HR TD PT24
14.0000 mg | MEDICATED_PATCH | Freq: Every day | TRANSDERMAL | 0 refills | Status: DC
Start: 2021-12-17 — End: 2022-03-18

## 2021-12-17 MED ORDER — AMLODIPINE BESYLATE 10 MG PO TABS: ORAL_TABLET | ORAL | 1 refills | Status: DC

## 2021-12-17 MED ORDER — NICOTINE 21 MG/24HR TD PT24: 21.0000 mg | MEDICATED_PATCH | Freq: Every day | TRANSDERMAL | 0 refills | Status: DC

## 2021-12-17 MED ORDER — NICOTINE 7 MG/24HR TD PT24: 7.0000 mg | MEDICATED_PATCH | Freq: Every day | TRANSDERMAL | 0 refills | Status: DC

## 2021-12-17 MED ORDER — HYDROCHLOROTHIAZIDE 25 MG PO TABS: ORAL_TABLET | ORAL | 1 refills | Status: DC

## 2021-12-17 NOTE — Progress Notes (Signed)
Pt ate around 10am

## 2021-12-17 NOTE — Patient Instructions (Signed)

## 2021-12-17 NOTE — Progress Notes (Signed)
Steve Nelson, is a 41 y.o. male  [phone]  [id number]  DOB - Sep 26, 1981  Chief Complaint  Patient presents with   Hypertension   Knee Pain       Subjective:   Steve Nelson is a 41 y.o. male here today for a follow up and management of HTN. Patient has No headache, No chest pain, No abdominal pain - No Nausea, No new weakness tingling or numbness, No Cough - SOB. Approximately a month ago fell going down the steps right leg went behind him and still is having knee pain.   No problems updated.  ALLERGIES: No Known Allergies  PAST MEDICAL HISTORY: No past medical history on file.  MEDICATIONS AT HOME: Prior to Admission medications   Medication Sig Start Date End Date Taking? Authorizing Provider  amLODipine (NORVASC) 10 MG tablet TAKE 1 TABLET(10 MG) BY MOUTH DAILY 11/17/21  Yes Kerin Perna, NP  hydrochlorothiazide (HYDRODIURIL) 25 MG tablet TAKE 1 TABLET(25 MG) BY MOUTH DAILY 12/15/21  Yes Kerin Perna, NP  buPROPion (WELLBUTRIN SR) 150 MG 12 hr tablet Take 1 tablet (150 mg total) by mouth 2 (two) times daily. Patient not taking: Reported on 12/17/2021 11/29/20   Kerin Perna, NP    Objective:   Vitals:   12/17/21 1404  BP: (!) 136/97  Pulse: (!) 104  Temp: 98.7 F (37.1 C)  TempSrc: Oral  SpO2: 94%  Weight: 225 lb 9.6 oz (102.3 kg)  Height: 5\' 7"  (1.702 m)   Exam General appearance : Awake, alert, not in any distress. Obese male. Speech Clear. Not toxic looking HEENT: Atraumatic and Normocephalic, pupils equally reactive to light and accomodation Neck: Supple, no JVD. No cervical lymphadenopathy.  Chest: Good air entry bilaterally, no added sounds  CVS: S1 S2 regular, no murmurs.  Abdomen: Bowel sounds present, Non tender and not distended with no gaurding, rigidity or rebound. Extremities: B/L Lower Ext shows no edema, both legs are warm to touch Neurology: Awake alert,  Non focal Skin: No  Rash  Data Review Lab Results  Component Value Date   HGBA1C 6.7 (A) 12/17/2021   HGBA1C 5.9 (A) 09/13/2019    Assessment & Plan   [name] was seen today for hypertension and knee pain.  Diagnoses and all orders for this visit:  Screening for diabetes mellitus -     HgB A1c 6.7   Type 2 diabetes mellitus without complication, without long-term current use of insulin (HCC) (new DX) Monitor foods that are high in carbohydrates are the following rice, potatoes, breads, sugars, and pastas.  Reduction in the intake (eating) will assist in lowering your blood sugars.  Metformin 500mg  XL daily  Class 2 severe obesity due to excess calories with serious comorbidity in adult, unspecified BMI ([place]) Obesity is 30-39 indicating an excess in caloric intake or underlining conditions. This may lead to other co-morbidities. Lifestyle modifications of diet and exercise may reduce obesity.    Essential hypertension Counseled on blood pressure goal of less than 130/80, low-sodium, DASH diet, medication compliance, 150 minutes of moderate intensity exercise per week. Discussed medication compliance, adverse effects.  Continue Norvasc 10mg  and HCTZ 25mg  daily   Tobacco abuse - I have recommended complete cessation of tobacco use. I have discussed various options available for assistance with tobacco cessation including over the counter methods (Nicotine gum, patch and lozenges). We also discussed prescription options  The patient is  interested in pursuing any prescription tobacco cessation options at  this time. Prescribed Nicoderm patches    Acute pain of right knee Work on losing weight to help reduce joint pain. May alternate with heat and ice application for pain relief. May also alternate with acetaminophen and Ibuprofen as prescribed pain relief. Other alternatives include massage, acupuncture and water aerobics.  You must stay active and avoid a sedentary lifestyle.  Patient have been counseled  extensively about nutrition and exercise. Other issues discussed during this visit include: low cholesterol diet, weight control and daily exercise, foot care, annual eye examinations at Ophthalmology, importance of adherence with medications and regular follow-up. We also discussed long term complications of uncontrolled diabetes and hypertension.   Return in about 3 months (around 03/17/2022) for DM/fasting.  This note has been created with Surveyor, quantity. Any transcriptional errors are unintentional.   Kerin Perna, NP 12/17/2021, 2:56 PM

## 2022-02-23 IMAGING — CT CT HEAD W/O CM
4 series · 17 of 47 positions shown, 19 images · non-contrast
Comparison: None.

CLINICAL DATA: Posterior and left-sided headache. Elevated blood
pressure

EXAM:
CT HEAD WITHOUT CONTRAST
TECHNIQUE: Contiguous axial images were obtained from the base of the skull
through the vertex without intravenous contrast.

[Series 3: head wo · axial · 0.41mm/px · z∈[-62,+58]mm · 7 of 33 slices shown, 9 images]
[im 5/33  brain]
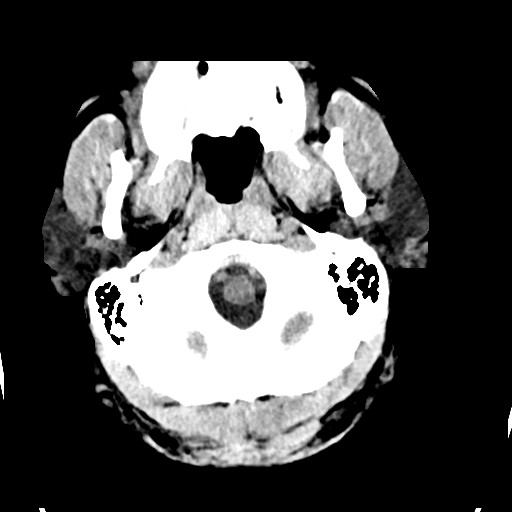
[im 5/33  bone]
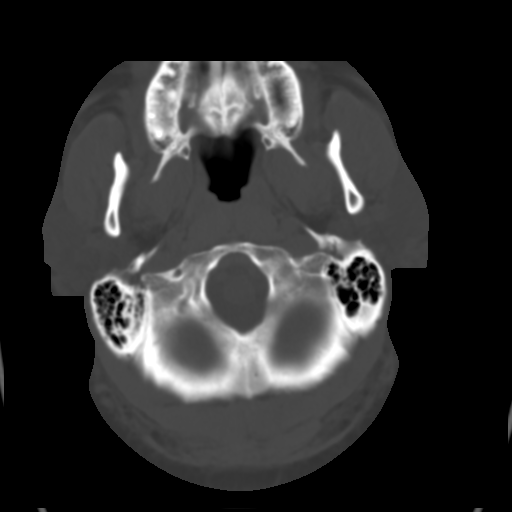
[im 9/33  brain]
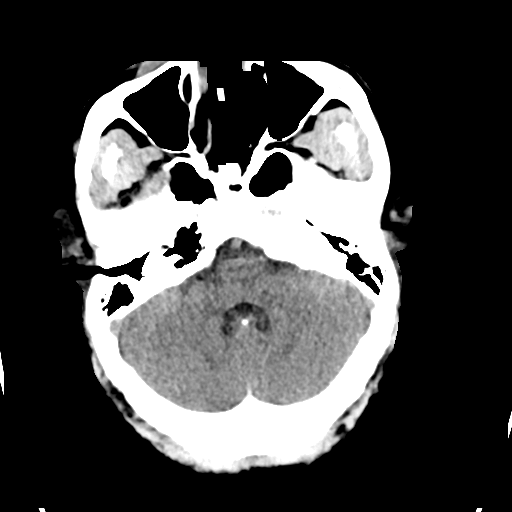
[im 13/33  brain]
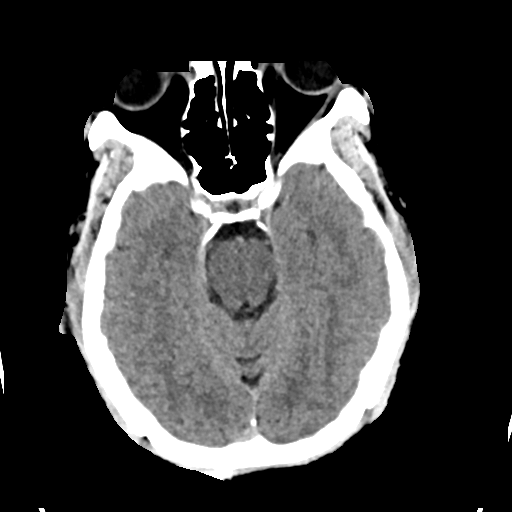
[im 17/33  brain]
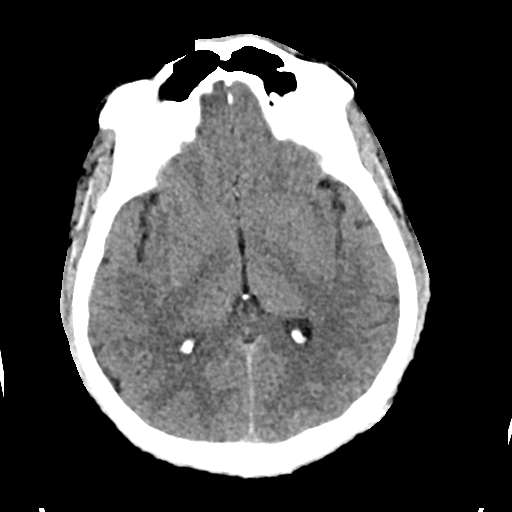
[im 21/33  brain]
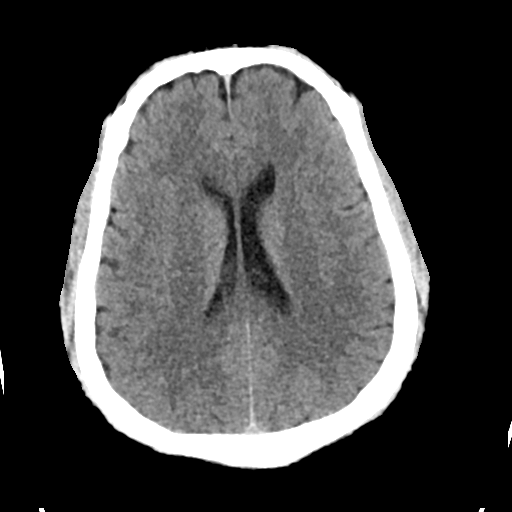
[im 21/33  bone]
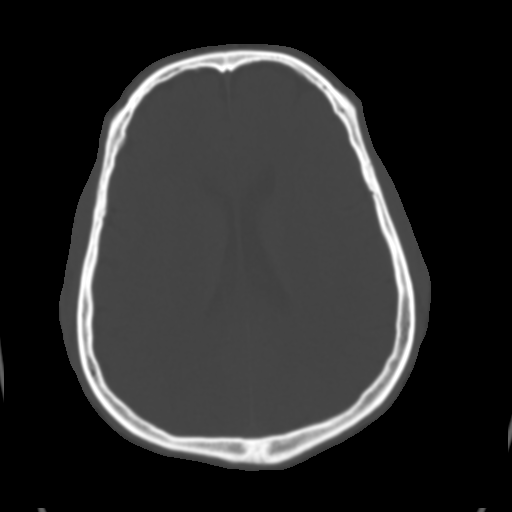
[im 25/33  brain]
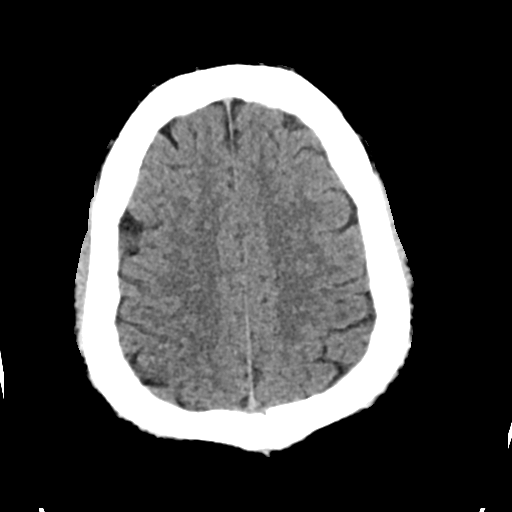
[im 29/33  brain]
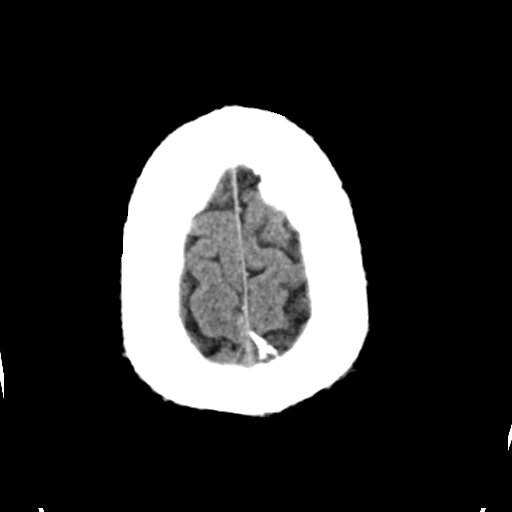

[Series 4: head bone · axial · 0.41mm/px · z∈[-66,-10]mm · 4 of 83 slices shown]
[im 9/83  bone]
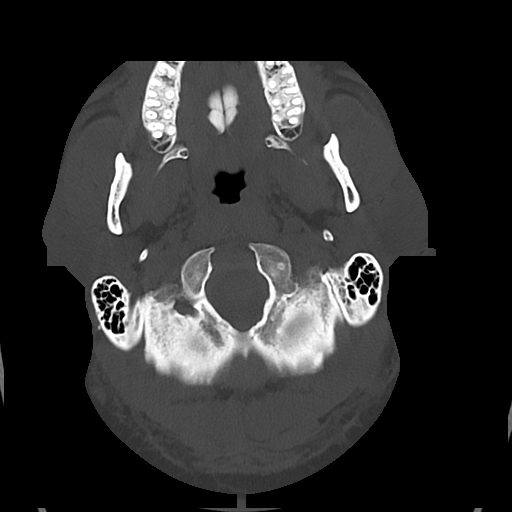
[im 17/83  bone]
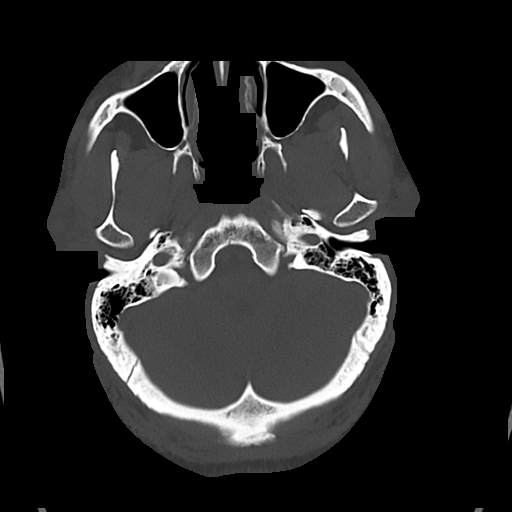
[im 25/83  bone]
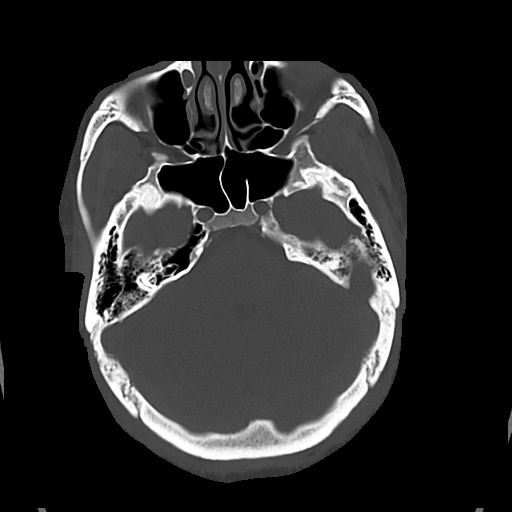
[im 37/83  bone]
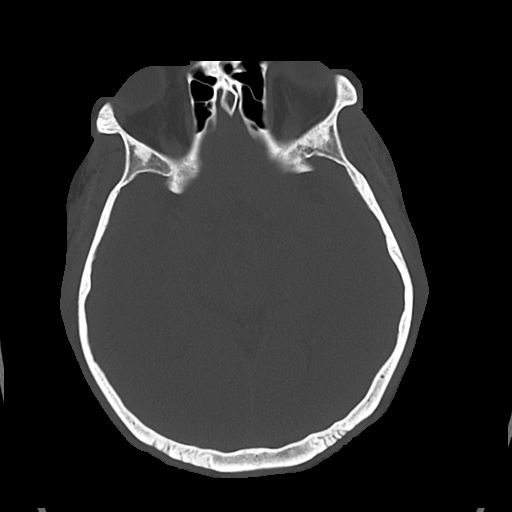

[Series 5: cor soft · coronal · 0.36mm/px · 3 of 75 slices shown]
[im 25/75  brain]
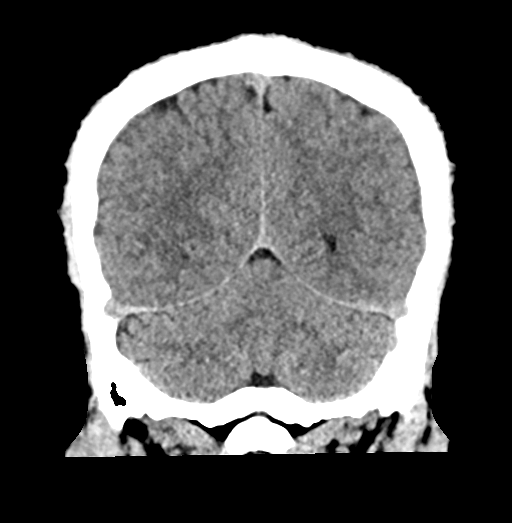
[im 33/75  brain]
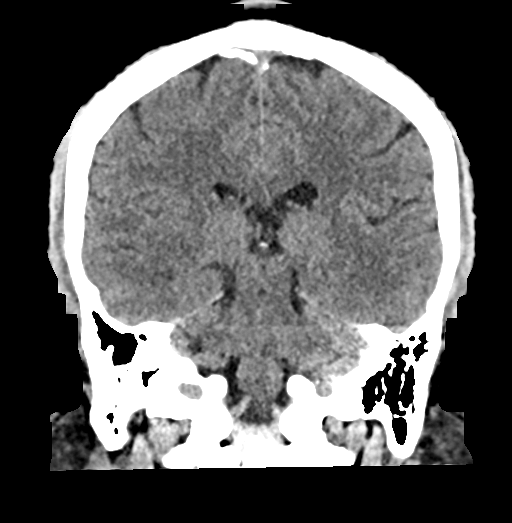
[im 42/75  brain]
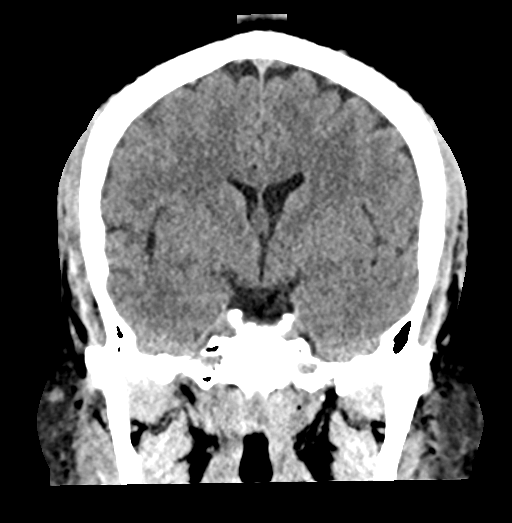

[Series 6: sag soft · sagittal · 0.37mm/px · 3 of 62 slices shown]
[im 21/62  brain]
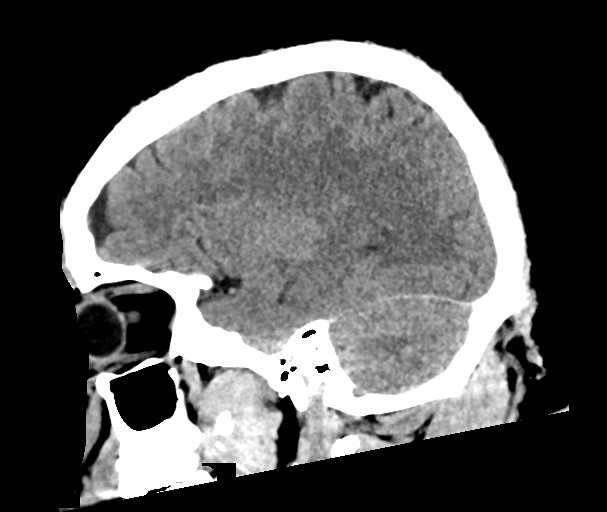
[im 31/62  brain]
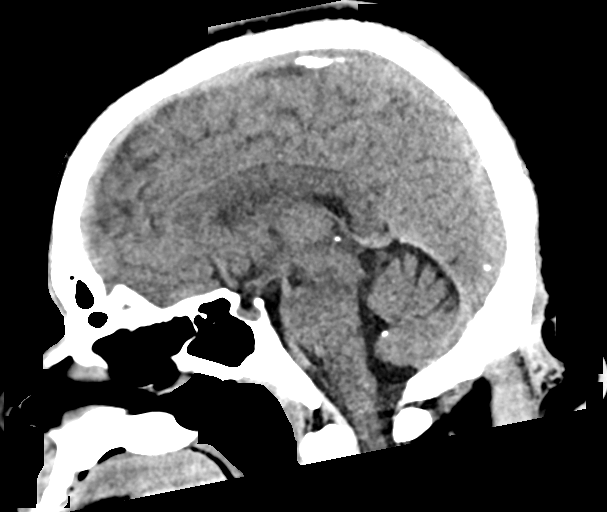
[im 41/62  brain]
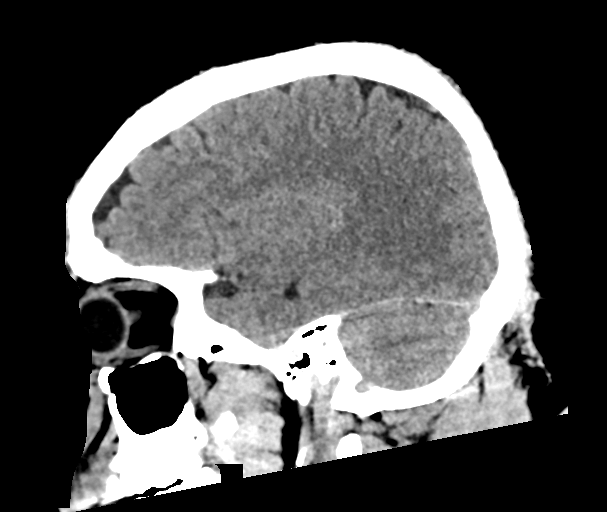

[17 of 47 positions shown; findings below may reference images not displayed]

FINDINGS: Brain: No evidence of acute infarction, hemorrhage, hydrocephalus,
extra-axial collection or mass lesion/mass effect.

Vascular: No hyperdense vessel or unexpected calcification.

Skull: Normal. Negative for fracture or focal lesion.

Sinuses/Orbits: Paranasal sinuses and mastoid air cells are clear.
Orbital structures within normal limits.

Other: None.
IMPRESSION: No acute intracranial findings.

## 2022-03-18 ENCOUNTER — Encounter (INDEPENDENT_AMBULATORY_CARE_PROVIDER_SITE_OTHER): Payer: Self-pay | Admitting: Primary Care

## 2022-03-18 ENCOUNTER — Ambulatory Visit (INDEPENDENT_AMBULATORY_CARE_PROVIDER_SITE_OTHER): Payer: 59 | Admitting: Primary Care

## 2022-03-18 VITALS — BP 124/82 | HR 99 | Temp 97.4°F | Ht 67.0 in | Wt 226.4 lb

## 2022-03-18 DIAGNOSIS — E119 Type 2 diabetes mellitus without complications: Secondary | ICD-10-CM

## 2022-03-18 DIAGNOSIS — F17200 Nicotine dependence, unspecified, uncomplicated: Secondary | ICD-10-CM

## 2022-03-18 DIAGNOSIS — Z6836 Body mass index (BMI) 36.0-36.9, adult: Secondary | ICD-10-CM

## 2022-03-18 DIAGNOSIS — I10 Essential (primary) hypertension: Secondary | ICD-10-CM | POA: Diagnosis not present

## 2022-03-18 DIAGNOSIS — Z760 Encounter for issue of repeat prescription: Secondary | ICD-10-CM

## 2022-03-18 DIAGNOSIS — F17210 Nicotine dependence, cigarettes, uncomplicated: Secondary | ICD-10-CM

## 2022-03-18 LAB — POCT GLYCOSYLATED HEMOGLOBIN (HGB A1C): Hemoglobin A1C: 6.5 % — AB (ref 4.0–5.6)

## 2022-03-18 MED ORDER — NICOTINE 14 MG/24HR TD PT24: 14.0000 mg | MEDICATED_PATCH | Freq: Every day | TRANSDERMAL | 0 refills | Status: DC

## 2022-03-18 MED ORDER — NICOTINE 7 MG/24HR TD PT24: 7.0000 mg | MEDICATED_PATCH | Freq: Every day | TRANSDERMAL | 0 refills | Status: DC

## 2022-03-18 MED ORDER — AMLODIPINE BESYLATE 10 MG PO TABS: ORAL_TABLET | ORAL | 1 refills | Status: DC

## 2022-03-18 MED ORDER — METFORMIN HCL ER 500 MG PO TB24: 500.0000 mg | ORAL_TABLET | Freq: Every day | ORAL | 1 refills | Status: DC

## 2022-03-18 MED ORDER — NICOTINE 21 MG/24HR TD PT24: 21.0000 mg | MEDICATED_PATCH | Freq: Every day | TRANSDERMAL | 0 refills | Status: DC

## 2022-03-18 MED ORDER — HYDROCHLOROTHIAZIDE 25 MG PO TABS: ORAL_TABLET | ORAL | 1 refills | Status: DC

## 2022-03-18 NOTE — Progress Notes (Signed)
?[place] ? ?Dionte Blaustein, is a 41 y.o. male ? ?OVF:[id number] ? ?ACZ:[id number] ? ?DOB - [birth year] ? ?Chief Complaint  ?Patient presents with  ? Follow-up  ?  diabetes  ? Medication Refill  ?  Needs new Rx for nicotine patches  ?    ? ?Subjective:  ? ?Steve Nelson is a 41 y.o. male here today for a follow up visit. Patient has No headache, No chest pain, No abdominal pain - No Nausea, No new weakness tingling or numbness, No Cough - shortness of breath. Complained of right toe pain a month ago was wearing flip flops riding a bike continues to hurt - requesting x-ray or MRI- right great toe not swollen , warm to touch or deformity appearance able to manipulate foot/toe. Also, has a callous on the side of the toe.Maybe rubbing against shoe. If pain continues send to podiatry. ? ?No problems updated. ? ?No Known Allergies ? ?No past medical history on file. ? ?No current outpatient medications on file prior to visit.  ? ?No current facility-administered medications on file prior to visit.  ? ?Comprehensive ROS Pertinent positive and negative noted in HPI   ?Objective:  ? ?Vitals:  ? 03/18/22 1104  ?BP: 124/82  ?Pulse: 99  ?Temp: (!) 97.4 ?F (36.3 ?C)  ?TempSrc: Oral  ?SpO2: 94%  ?Weight: 226 lb 6.4 oz (102.7 kg)  ?Height: 5' 7"  (1.702 m)  ? ?Exam ?General appearance : obese male Awake, alert, not in any distress. Speech Clear. Not toxic looking ?HEENT: Atraumatic and Normocephalic, pupils equally reactive to light and accomodation ?Neck: Supple, no JVD. No cervical lymphadenopathy.  ?Chest: Good air entry bilaterally, no added sounds  ?CVS: S1 S2 regular, no murmurs.  ?Abdomen: Bowel sounds present, Non tender and not distended with no gaurding, rigidity or rebound. ?Extremities: B/L Lower Ext shows no edema, both legs are warm to touch,no patellar reflexes  ?Neurology: Awake alert, and oriented X 3, Non focal ?Skin: No Rash ? ?Data Review ?Lab Results  ?Component Value Date  ? HGBA1C 6.5 (A)  03/18/2022  ? HGBA1C 6.7 (A) 12/17/2021  ? HGBA1C 5.9 (A) 09/13/2019  ? ?Comprehensive ROS Pertinent positive and negative noted in HPI  ? ?Assessment & Plan  ?Hashem was seen today for follow-up and medication refill. ? ?Diagnoses and all orders for this visit: ? ?Type 2 diabetes mellitus without complication, without long-term current use of insulin ([place]) ?-     HgB A1c 6.5  ?-     CBC with Differential ?-     metFORMIN (GLUCOPHAGE XR) 500 MG 24 hr tablet; Take 1 tablet (500 mg total) by mouth daily with breakfast. ? ?Essential hypertension ?He has reached blood pressure goal of less than 130/80, low-sodium, DASH diet, medication compliance, 150 minutes of moderate intensity exercise per week. ?Discussed medication compliance, adverse effects.  ?-     CMP14+EGFR ?-     hydrochlorothiazide (HYDRODIURIL) 25 MG tablet; TAKE 1 TABLET(25 MG) BY MOUTH DAILY ?-     amLODipine (NORVASC) 10 MG tablet; TAKE 1 TABLET(10 MG) BY MOUTH DAILY ? ?Tobacco dependency ?-Patient is interested in pursuing nicoderm patches again stop smoking while on them - tobacco cessation  ? ? ?Class 2 severe obesity due to excess calories with serious comorbidity in adult, unspecified BMI ([place]) ?Obesity is 30-39 indicating an excess in caloric intake or underlining conditions. This may lead to other co-morbidities. Lifestyle modifications of diet and exercise may reduce obesity.   ?-  Lipid Panel ? ?Medication refill ?-     metFORMIN (GLUCOPHAGE XR) 500 MG 24 hr tablet; Take 1 tablet (500 mg total) by mouth daily with breakfast. ?-     hydrochlorothiazide (HYDRODIURIL) 25 MG tablet; TAKE 1 TABLET(25 MG) BY MOUTH DAILY ?-     amLODipine (NORVASC) 10 MG tablet; TAKE 1 TABLET(10 MG) BY MOUTH DAILY ? ? ? ?Patient have been counseled extensively about nutrition and exercise. Other issues discussed during this visit include: low cholesterol diet, weight control and daily exercise, foot care, annual eye examinations at Ophthalmology, importance of  adherence with medications and regular follow-up. We also discussed long term complications of uncontrolled diabetes and hypertension.  ? ?Return in about 6 months (around 09/17/2022) for HTN/T2D/fasting. ? ?The patient was given clear instructions to go to ER or return to medical center if symptoms don't improve, worsen or new problems develop. The patient verbalized understanding. The patient was told to call to get lab results if they haven't heard anything in the next week.  ? ?This note has been created with [employer], quantity. Any transcriptional errors are unintentional.  ? ?Kerin Perna, NP ?03/18/2022, 11:38 AM  ?

## 2022-03-18 NOTE — Patient Instructions (Signed)
Tobacco Use Disorder Tobacco use disorder (TUD) occurs when a person craves, seeks, and uses tobacco, regardless of the consequences. This disorder can cause problems with mental and physical health. It can affect your ability to have healthy relationships. It can also keep you from meeting your responsibilities at work, home, or school. Tobacco products contain a dangerous chemical called nicotine. Nicotine triggers hormones that make the body feel stimulated and works on areas of the brain that make a person feel good. These effects can make the person depend on nicotine, which makes it hard to quit tobacco. Tobacco may be: Smoked as a cigarette or cigar. Inhaled using vaping devices, such as e-cigarettes. Smoked in a pipe or hookah. Chewed as smokeless tobacco. Inhaled into the nostrils as snuff. What are the causes? This condition is caused by using nicotine. Nicotine causes changes in your brain that make you want more and more. This is called addiction. This can make it hard to stop using tobacco once you start. What are the signs or symptoms? Symptoms of TUD may include: Being unable to stop your tobacco use even after planned quit attempts. Spending an abnormal amount of time getting or using tobacco. Craving tobacco. Tobacco use that: Interferes with your work, school, or home life. Interferes with your personal and social relationships. Makes you give up activities that you once enjoyed or found important. Using tobacco even though you know that it is: Dangerous or bad for your health or someone else's health. Causing problems in your life. Needing more and more of the substance to get the same effect (developing tolerance). Using the substance to avoid unpleasant symptoms if you do not use the substance (withdrawal). How is this diagnosed? This condition may be diagnosed based on: Your current and past tobacco use. Your health care provider may ask questions about how your  tobacco use affects your life. A physical exam. You may be diagnosed with TUD if you have at least two symptoms within a 12-month period. How is this treated? This condition is treated by stopping tobacco use. Many people are unable to quit on their own and need help. Treatment may include: Nicotine replacement therapy (NRT). NRT provides nicotine without the other harmful chemicals in tobacco. NRT gradually lowers the dosage of nicotine in the body and reduces withdrawal symptoms. NRT is available as: Over-the-counter gums, lozenges, and skin patches. Prescription mouth inhalers and nasal sprays. Medicine that acts on the brain to reduce cravings and withdrawal symptoms. A type of talk therapy that examines your triggers for tobacco use, how to avoid them, and how to cope with cravings (behavioral therapy). Hypnosis. This may help with withdrawal symptoms. Joining a support group for people coping with TUD. The best treatment for TUD is usually a combination of medicine, talk therapy, and support groups. Recovery can be a long process. Many people start using tobacco again after stopping (relapse). If you relapse, it does not mean that treatment will not work. Follow these instructions at home:  Lifestyle Do not use any products that contain nicotine or tobacco. These products include cigarettes, chewing tobacco, and vaping devices, such as e-cigarettes. If you need help quitting, ask your health care provider. Avoid things that trigger tobacco use as much as you can. Triggers include people and situations that usually cause you to use tobacco. Avoid drinks that contain caffeine, including coffee. These may worsen some withdrawal symptoms. Find ways to manage stress. Wanting to smoke may cause stress, and stress can make you want   to smoke. Relaxation techniques such as deep breathing, meditation, and yoga may help. Attend support groups as needed. These groups are an important part of long-term  recovery for many people. General instructions Take over-the-counter and prescription medicines only as told by your health care provider. Check with your health care provider before taking any new prescription or over-the-counter medicines. Decide on a friend, family member, or smoking quit-line (such as 1-800-QUIT-NOW in the [place] that you can call or text when you feel the urge to smoke or when you need help coping with cravings. Keep all follow-up visits. This is important. Contact a health care provider if: You are not able to take your medicines as told by your health care provider. Your symptoms get worse, even with treatment. Summary Tobacco use disorder (TUD) occurs when a person craves, seeks, and uses tobacco regardless of the consequences. This condition may be diagnosed based on your current and past tobacco use and a physical exam. Many people are unable to quit on their own and need help. Recovery can be a long process. The most effective treatment for TUD is usually a combination of medicine, talk therapy, and support groups. This information is not intended to replace advice given to you by your health care provider. Make sure you discuss any questions you have with your health care provider. Document Revised: 11/06/2021 Document Reviewed: 11/06/2021 Elsevier Patient Education  2023 Elsevier Inc.  

## 2022-03-19 LAB — CBC WITH DIFFERENTIAL/PLATELET
Basophils Absolute: 0.1 10*3/uL (ref 0.0–0.2)
Basos: 1 %
EOS (ABSOLUTE): 0.1 10*3/uL (ref 0.0–0.4)
Eos: 2 %
Hematocrit: 46.6 % (ref 37.5–51.0)
Hemoglobin: 15.6 g/dL (ref 13.0–17.7)
Immature Grans (Abs): 0 10*3/uL (ref 0.0–0.1)
Immature Granulocytes: 0 %
Lymphocytes Absolute: 1.6 10*3/uL (ref 0.7–3.1)
Lymphs: 28 %
MCH: 28.5 pg (ref 26.6–33.0)
MCHC: 33.5 g/dL (ref 31.5–35.7)
MCV: 85 fL (ref 79–97)
Monocytes Absolute: 0.6 10*3/uL (ref 0.1–0.9)
Monocytes: 11 %
Neutrophils Absolute: 3.2 10*3/uL (ref 1.4–7.0)
Neutrophils: 58 %
Platelets: 391 10*3/uL (ref 150–450)
RBC: 5.48 x10E6/uL (ref 4.14–5.80)
RDW: 13.3 % (ref 11.6–15.4)
WBC: 5.5 10*3/uL (ref 3.4–10.8)

## 2022-03-19 LAB — LIPID PANEL
Chol/HDL Ratio: 4.6 ratio (ref 0.0–5.0)
Cholesterol, Total: 238 mg/dL — ABNORMAL HIGH (ref 100–199)
HDL: 52 mg/dL (ref >39)
LDL Chol Calc (NIH): 164 mg/dL — ABNORMAL HIGH (ref 0–99)
Triglycerides: 121 mg/dL (ref 0–149)
VLDL Cholesterol Cal: 22 mg/dL (ref 5–40)

## 2022-03-19 LAB — CMP14+EGFR
ALT: 65 IU/L — ABNORMAL HIGH (ref 0–44)
AST: 32 IU/L (ref 0–40)
Albumin/Globulin Ratio: 1.6 (ref 1.2–2.2)
Albumin: 5 g/dL (ref 4.0–5.0)
Alkaline Phosphatase: 41 IU/L — ABNORMAL LOW (ref 44–121)
BUN/Creatinine Ratio: 14 (ref 9–20)
BUN: 16 mg/dL (ref 6–24)
Bilirubin Total: <0.2 mg/dL (ref 0.0–1.2)
CO2: 20 mmol/L (ref 20–29)
Calcium: 10.4 mg/dL — ABNORMAL HIGH (ref 8.7–10.2)
Chloride: 104 mmol/L (ref 96–106)
Creatinine, Ser: 1.17 mg/dL (ref 0.76–1.27)
Globulin, Total: 3.1 g/dL (ref 1.5–4.5)
Glucose: 118 mg/dL — ABNORMAL HIGH (ref 70–99)
Potassium: 5 mmol/L (ref 3.5–5.2)
Sodium: 140 mmol/L (ref 134–144)
Total Protein: 8.1 g/dL (ref 6.0–8.5)
eGFR: 80 mL/min/{1.73_m2} (ref >59)

## 2022-03-21 ENCOUNTER — Other Ambulatory Visit (INDEPENDENT_AMBULATORY_CARE_PROVIDER_SITE_OTHER): Payer: Self-pay | Admitting: Primary Care

## 2022-03-21 DIAGNOSIS — E1169 Type 2 diabetes mellitus with other specified complication: Secondary | ICD-10-CM

## 2022-03-21 MED ORDER — ROSUVASTATIN CALCIUM 20 MG PO TABS: 20.0000 mg | ORAL_TABLET | Freq: Every day | ORAL | 1 refills | Status: DC

## 2022-09-16 ENCOUNTER — Ambulatory Visit (INDEPENDENT_AMBULATORY_CARE_PROVIDER_SITE_OTHER): Payer: Self-pay | Admitting: Primary Care

## 2022-09-16 ENCOUNTER — Encounter (INDEPENDENT_AMBULATORY_CARE_PROVIDER_SITE_OTHER): Payer: Self-pay | Admitting: Primary Care

## 2022-09-16 VITALS — BP 148/92 | HR 87 | Resp 16 | Ht 66.0 in | Wt 220.0 lb

## 2022-09-16 DIAGNOSIS — F17200 Nicotine dependence, unspecified, uncomplicated: Secondary | ICD-10-CM

## 2022-09-16 DIAGNOSIS — Z1159 Encounter for screening for other viral diseases: Secondary | ICD-10-CM

## 2022-09-16 DIAGNOSIS — I10 Essential (primary) hypertension: Secondary | ICD-10-CM

## 2022-09-16 DIAGNOSIS — E119 Type 2 diabetes mellitus without complications: Secondary | ICD-10-CM

## 2022-09-16 DIAGNOSIS — Z6835 Body mass index (BMI) 35.0-35.9, adult: Secondary | ICD-10-CM

## 2022-09-16 DIAGNOSIS — F17210 Nicotine dependence, cigarettes, uncomplicated: Secondary | ICD-10-CM

## 2022-09-16 DIAGNOSIS — E66812 Obesity, class 2: Secondary | ICD-10-CM

## 2022-09-16 MED ORDER — VALSARTAN-HYDROCHLOROTHIAZIDE 160-25 MG PO TABS: 1.0000 | ORAL_TABLET | Freq: Every day | ORAL | 3 refills | Status: DC

## 2022-09-16 NOTE — Patient Instructions (Signed)
Valsartan; Hydrochlorothiazide Tablets What is this medication? VALSARTAN; HYDROCHLOROTHIAZIDE (val SAR tan; hye droe klor oh THYE a zide) treats high blood pressure. It relaxes your blood vessels and helps your kidneys remove more fluid through the urine, which lowers blood pressure. It is a combination of an ARB and diuretic. This medicine may be used for other purposes; ask your health care provider or pharmacist if you have questions. COMMON BRAND NAME(S): Diovan HCT What should I tell my care team before I take this medication? They need to know if you have any of these conditions: Diabetes Gout Heart failure Kidney disease Liver disease Lupus Pancreatitis An unusual or allergic reaction to valsartan, hydrochlorothiazide, sulfa medications, other medications, foods, dyes, or preservatives Pregnant or trying to get pregnant Breast-feeding How should I use this medication? Take this medication by mouth. Take it as directed on the prescription label at the same time every day. You can take it with or without food. If it upsets your stomach, take it with food. Keep taking it unless your care team tells you to stop. Talk to your care team about the use of this medication in children. Special care may be needed. Overdosage: If you think you have taken too much of this medicine contact a poison control center or emergency room at once. NOTE: This medicine is only for you. Do not share this medicine with others. What if I miss a dose? If you miss a dose, take it as soon as you can. If it is almost time for your next dose, take only that dose. Do not take double or extra doses. What may interact with this medication? Do not take this medication with any of the following: Cidofovir Dofetilide This medication may also interact with the following: Aliskiren ACE inhibitors, like enalapril or lisinopril Cholestyramine Colestipol Digoxin Lithium Medications for blood pressure Medications  for diabetes Medications that relax muscles for surgery NSAIDs, medications for pain and inflammation, like ibuprofen or naproxen Potassium salts or potassium supplements Other diuretics, especially triamterene, spironolactone, or amiloride Steroid medications like prednisone or cortisone This list may not describe all possible interactions. Give your health care provider a list of all the medicines, herbs, non-prescription drugs, or dietary supplements you use. Also tell them if you smoke, drink alcohol, or use illegal drugs. Some items may interact with your medicine. What should I watch for while using this medication? Visit your care team for regular check-ups. Check your blood pressure as directed. Ask your care team what your blood pressure should be. Also, find out when you should contact them. Do not treat yourself for coughs, colds, or pain while you are using this medication without asking your care team for advice. Some medications may increase your blood pressure. Women should inform their care team if they wish to become pregnant or think they might be pregnant. There is a potential for serious side effects to an unborn child. Talk to your care team for more information. You may get drowsy or dizzy. Do not drive, use machinery, or do anything that needs mental alertness until you know how this medication affects you. Do not stand or sit up quickly, especially if you are an older patient. This reduces the risk of dizzy or fainting spells. Alcohol can make you more drowsy and dizzy. Avoid alcoholic drinks. Talk to your care team about your risk of skin cancer. You may be more at risk for skin cancer if you take this medication. This medication can make you more sensitive   to the sun. Keep out of the sun. If you cannot avoid being in the sun, wear protective clothing and use sunscreen. Do not use sun lamps or tanning beds/booths. Avoid salt substitutes unless you are told otherwise by your  care team. You may need to be on a special diet while taking this medication. Ask your care team. Also, find out how many glasses of fluids you need to drink each day. Check with your care team if you get an attack of severe diarrhea, nausea and vomiting, or if you sweat a lot. The loss of too much body fluid can make it dangerous for you to take this medication. This medication may increase blood sugar. Ask your care team if changes in diet or medications are needed if you have diabetes. What side effects may I notice from receiving this medication? Side effects that you should report to your care team as soon as possible: Allergic reactions--skin rash, itching, hives, swelling of the face, lips, tongue, or throat Dehydration--increased thirst, dry mouth, feeling faint or lightheaded, headache, dark yellow or brown urine Gout--severe pain, redness, warmth, or swelling in joints, such as the big toe Kidney injury--decrease in the amount of urine, swelling of the ankles, hands, or feet Low blood pressure--dizziness, feeling faint or lightheaded, blurry vision Low potassium level--muscle pain or cramps, unusual weakness or fatigue, fast or irregular heartbeat, constipation Sudden eye pain or change in vision such as blurry vision, seeing halos around lights, vision loss Side effects that usually do not require medical attention (report to your care team if they continue or are bothersome): Change in sex drive or performance Dizziness Fatigue Headache Upset stomach This list may not describe all possible side effects. Call your doctor for medical advice about side effects. You may report side effects to FDA at 1-800-FDA-1088. Where should I keep my medication? Keep out of the reach of children and pets. Store at room temperature between 20 and 25 degrees C (68 and 77 degrees F). Protect from moisture. Keep the container tightly closed. Get rid of any unused medication after the expiration date. To  get rid of medications that are no longer needed or have expired: Take the medications to a medication take-back program. Check with your pharmacy or law enforcement to find a location. If you cannot return the medication, check the label or package insert to see if the medication should be thrown out in the garbage or flushed down the toilet. If you are not sure, ask your care team. If it is safe to put in the trash, empty the medication out of the container. Mix the medication with cat litter, dirt, coffee grounds, or other unwanted substance. Seal the mixture in a bag or container. Put it in the trash. NOTE: This sheet is a summary. It may not cover all possible information. If you have questions about this medicine, talk to your doctor, pharmacist, or health care provider.  2023 Elsevier/Gold Standard (2008-01-02 00:00:00)  

## 2022-09-16 NOTE — Progress Notes (Signed)
Subjective:  Patient ID: Steve Nelson, male    DOB: [birth year]  Age: 41 y.o. MRN: [id number]  CC: Diabetes and Hypertension   HPI Steve Nelson presents for follow-up of diabetes. Patient does not check blood sugar at home Pertinent ROS:  Polyuria - No Polydipsia - No Vision problems - No HTN management -Patient has No headache, No chest pain, No abdominal pain - No Nausea, No new weakness tingling or numbness, No Cough - shortness of breath  Medications as noted below. Taking them regularly without complication/adverse reaction being reported today.   History Steve Nelson has no past medical history on file.   Steve Nelson has no past surgical history on file.   His family history includes Diabetes in his mother; Hypertension in his mother.Steve Nelson reports that Steve Nelson has been smoking cigarettes. Steve Nelson has never used smokeless tobacco. Steve Nelson reports current alcohol use. Steve Nelson reports that Steve Nelson does not use drugs.  Current Outpatient Medications on File Prior to Visit  Medication Sig Dispense Refill   amLODipine (NORVASC) 10 MG tablet TAKE 1 TABLET(10 MG) BY MOUTH DAILY 90 tablet 1   metFORMIN (GLUCOPHAGE XR) 500 MG 24 hr tablet Take 1 tablet (500 mg total) by mouth daily with breakfast. 90 tablet 1   rosuvastatin (CRESTOR) 20 MG tablet Take 1 tablet (20 mg total) by mouth daily. (Patient not taking: Reported on 09/16/2022) 90 tablet 1   No current facility-administered medications on file prior to visit.    ROS Comprehensive ROS Pertinent positive and negative noted in HPI    Objective:  BP (!) 148/92   Pulse 87   Resp 16   Ht _0  (1.676 m)   Wt 220 lb (99.8 kg)   SpO2 98%   BMI 35.51 kg/m   BP Readings from Last 3 Encounters:  09/16/22 (!) 148/92  03/18/22 124/82  12/17/21 (!) 136/97    Wt Readings from Last 3 Encounters:  09/16/22 220 lb (99.8 kg)  03/18/22 226 lb 6.4 oz (102.7 kg)  12/17/21 225 lb 9.6 oz (102.3 kg)   Physical exam  General: Vital signs reviewed.  Patient is  well-developed and well-nourished, obese male in no acute distress and cooperative with exam. Head: Normocephalic and atraumatic. Eyes: EOMI, conjunctivae normal, no scleral icterus. Neck: Supple, trachea midline, normal ROM, no JVD, masses, thyromegaly, or carotid bruit present. Cardiovascular: RRR, S1 normal, S2 normal, no murmurs, gallops, or rubs. Pulmonary/Chest: Clear to auscultation bilaterally, no wheezes, rales, or rhonchi. Abdominal: Soft, non-tender, non-distended, BS +, no masses, organomegaly, or guarding present. Musculoskeletal: No joint deformities, erythema, or stiffness, ROM full and nontender. Extremities: No lower extremity edema bilaterally,  pulses symmetric and intact bilaterally. No cyanosis or clubbing. Neurological: A&O x3, Strength is normal Skin: Warm, dry and intact. No rashes or erythema. Psychiatric: Normal mood and affect. speech and behavior is normal. Cognition and memory are normal.    Lab Results  Component Value Date   HGBA1C 6.5 (A) 03/18/2022   HGBA1C 6.7 (A) 12/17/2021   HGBA1C 5.9 (A) 09/13/2019    Lab Results  Component Value Date   WBC 5.5 03/18/2022   HGB 15.6 03/18/2022   HCT 46.6 03/18/2022   PLT 391 03/18/2022   GLUCOSE 118 (H) 03/18/2022   CHOL 238 (H) 03/18/2022   TRIG 121 03/18/2022   HDL 52 03/18/2022   LDLCALC 164 (H) 03/18/2022   ALT 65 (H) 03/18/2022   AST 32 03/18/2022   NA 140 03/18/2022   K 5.0 03/18/2022   CL 104  03/18/2022   CREATININE 1.17 03/18/2022   BUN 16 03/18/2022   CO2 20 03/18/2022   TSH 1.230 10/18/2019   HGBA1C 6.5 (A) 03/18/2022     Assessment & Plan:   [name] was seen today for diabetes and hypertension.  Diagnoses and all orders for this visit:  Type 2 diabetes mellitus without complication, without long-term current use of insulin ([place]) - educated on lifestyle modifications, including but not limited to diet choices and adding exercise to daily routine.   -     Microalbumin / creatinine  urine ratio -     Cancel: POCT glycosylated hemoglobin (Hb A1C) -     Hemoglobin A1c  Tobacco dependency - I have recommended complete cessation of tobacco use. I have discussed various options available for assistance with tobacco cessation including over the counter methods (Nicotine gum, patch and lozenges). We also discussed prescription options (Chantix, Nicotine Inhaler / Nasal Spray). The patient is not interested in pursuing any prescription tobacco cessation options at this time. - Patient declines at this time.   Essential hypertension BP goal - < 130/80 (out of medications) Explained that having normal blood pressure is the goal and medications are helping to get to goal and maintain normal blood pressure. DIET: Limit salt intake, read nutrition labels to check salt content, limit fried and high fatty foods  Avoid using multisymptom OTC cold preparations that generally contain sudafed which can rise BP. Consult with pharmacist on best cold relief products to use for persons with HTN EXERCISE Discussed incorporating exercise such as walking - 30 minutes most days of the week and can do in 10 minute intervals    -     CBC -     CMP14+EGFR -     valsartan-hydrochlorothiazide (DIOVAN-HCT) 160-25 MG tablet; Take 1 tablet by mouth daily.-     Microalbumin / creatinine urine ratio  Class 2 severe obesity due to excess calories with serious comorbidity in adult, unspecified BMI ([place]) Obesity is 30-39 indicating an excess in caloric intake or underlining conditions. This may lead to other co-morbidities. Educated on lifestyle modifications of diet and exercise which may reduce obesity.    Need for hepatitis C screening test -     HCV Ab w Reflex to Quant PCR   I have discontinued [facility] nicotine, nicotine, nicotine, and hydrochlorothiazide. I am also having Steve Nelson start on valsartan-hydrochlorothiazide. Additionally, I am having Steve Nelson maintain his metFORMIN, amLODipine, and  rosuvastatin.  Meds ordered this encounter  Medications   valsartan-hydrochlorothiazide (DIOVAN-HCT) 160-25 MG tablet    Sig: Take 1 tablet by mouth daily.    Dispense:  90 tablet    Refill:  3    Order Specific Question:   Supervising Provider    Answer:   Tresa Garter [id number]     Follow-up:   Return in about 6 weeks (around 10/28/2022) for Bp ck .  The above assessment and management plan was discussed with the patient. The patient verbalized understanding of and has agreed to the management plan. Patient is aware to call the clinic if symptoms fail to improve or worsen. Patient is aware when to return to the clinic for a follow-up visit. Patient educated on when it is appropriate to go to the emergency department.   Juluis Mire, NP-C

## 2022-09-17 ENCOUNTER — Other Ambulatory Visit (INDEPENDENT_AMBULATORY_CARE_PROVIDER_SITE_OTHER): Payer: Self-pay | Admitting: Primary Care

## 2022-09-17 DIAGNOSIS — Z760 Encounter for issue of repeat prescription: Secondary | ICD-10-CM

## 2022-09-17 DIAGNOSIS — E119 Type 2 diabetes mellitus without complications: Secondary | ICD-10-CM

## 2022-09-17 LAB — CBC
Hematocrit: 46.5 % (ref 37.5–51.0)
Hemoglobin: 15.4 g/dL (ref 13.0–17.7)
MCH: 28.4 pg (ref 26.6–33.0)
MCHC: 33.1 g/dL (ref 31.5–35.7)
MCV: 86 fL (ref 79–97)
Platelets: 380 10*3/uL (ref 150–450)
RBC: 5.42 x10E6/uL (ref 4.14–5.80)
RDW: 13.3 % (ref 11.6–15.4)
WBC: 4.8 10*3/uL (ref 3.4–10.8)

## 2022-09-17 LAB — MICROALBUMIN / CREATININE URINE RATIO
Creatinine, Urine: 68 mg/dL
Microalb/Creat Ratio: 75 mg/g creat — ABNORMAL HIGH (ref 0–29)
Microalbumin, Urine: 50.9 ug/mL

## 2022-09-17 LAB — CMP14+EGFR
ALT: 33 IU/L (ref 0–44)
AST: 23 IU/L (ref 0–40)
Albumin/Globulin Ratio: 1.9 (ref 1.2–2.2)
Albumin: 5.2 g/dL — ABNORMAL HIGH (ref 4.1–5.1)
Alkaline Phosphatase: 41 IU/L — ABNORMAL LOW (ref 44–121)
BUN/Creatinine Ratio: 11 (ref 9–20)
BUN: 10 mg/dL (ref 6–24)
Bilirubin Total: 0.4 mg/dL (ref 0.0–1.2)
CO2: 22 mmol/L (ref 20–29)
Calcium: 10.1 mg/dL (ref 8.7–10.2)
Chloride: 101 mmol/L (ref 96–106)
Creatinine, Ser: 0.95 mg/dL (ref 0.76–1.27)
Globulin, Total: 2.7 g/dL (ref 1.5–4.5)
Glucose: 113 mg/dL — ABNORMAL HIGH (ref 70–99)
Potassium: 4.6 mmol/L (ref 3.5–5.2)
Sodium: 140 mmol/L (ref 134–144)
Total Protein: 7.9 g/dL (ref 6.0–8.5)
eGFR: 102 mL/min/{1.73_m2} (ref >59)

## 2022-09-17 LAB — HCV INTERPRETATION

## 2022-09-17 LAB — HCV AB W REFLEX TO QUANT PCR: HCV Ab: NONREACTIVE

## 2022-09-17 LAB — HEMOGLOBIN A1C
Est. average glucose Bld gHb Est-mCnc: 146 mg/dL
Hgb A1c MFr Bld: 6.7 % — ABNORMAL HIGH (ref 4.8–5.6)

## 2022-09-17 MED ORDER — METFORMIN HCL ER 500 MG PO TB24: 500.0000 mg | ORAL_TABLET | Freq: Every day | ORAL | 1 refills | Status: DC

## 2022-10-09 IMAGING — DX DG CHEST 1V
1 series · 1 of 1 positions shown · non-contrast
Comparison: None.

CLINICAL DATA: Cough

EXAM:
CHEST  1 VIEW

[chest]
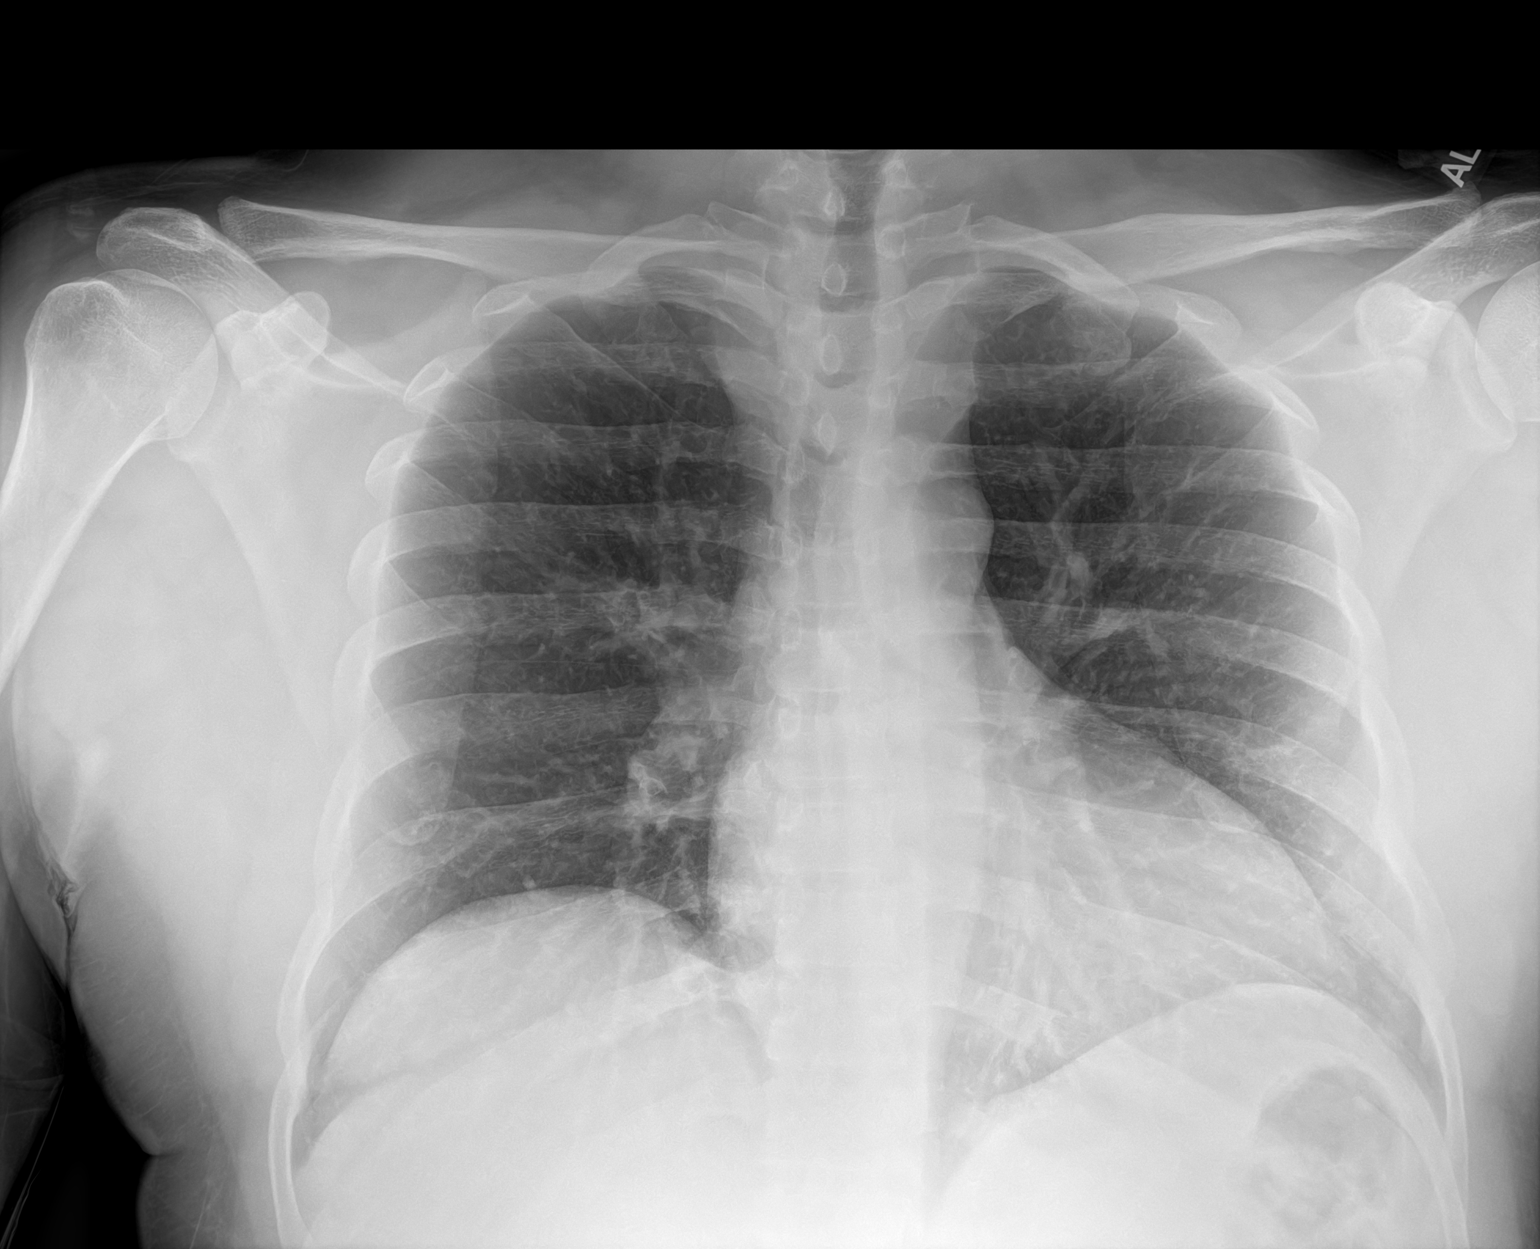

[1 of 1 positions shown; findings below may reference images not displayed]

FINDINGS: Cardiac and mediastinal contours are within normal limits for AP
technique. Normal mediastinal contours. Both lungs are clear. The
visualized skeletal structures are unremarkable.
IMPRESSION: No active disease.

## 2022-10-28 ENCOUNTER — Ambulatory Visit (INDEPENDENT_AMBULATORY_CARE_PROVIDER_SITE_OTHER): Payer: 59 | Admitting: Primary Care

## 2022-11-14 ENCOUNTER — Ambulatory Visit (INDEPENDENT_AMBULATORY_CARE_PROVIDER_SITE_OTHER): Payer: Self-pay | Admitting: Primary Care

## 2022-11-14 ENCOUNTER — Encounter (INDEPENDENT_AMBULATORY_CARE_PROVIDER_SITE_OTHER): Payer: Self-pay | Admitting: Primary Care

## 2022-11-14 VITALS — BP 126/81 | HR 93 | Ht 66.0 in

## 2022-11-14 DIAGNOSIS — I10 Essential (primary) hypertension: Secondary | ICD-10-CM

## 2022-11-14 NOTE — Progress Notes (Signed)
[facility]   Steve Nelson is a 41 y.o. male presents for hypertension evaluation, Denies shortness of breath, headaches, chest pain or lower extremity edema, sudden onset, vision changes, unilateral weakness, dizziness, paresthesias   Patient reports adherence with medications.  Dietary habits include: Monitoring carbohydrates and sodium intake Exercise habits include: Walking Family / Social history: Mother had hypertension and diabetes   History reviewed. No pertinent past medical history. History reviewed. No pertinent surgical history. No Known Allergies Current Outpatient Medications on File Prior to Visit  Medication Sig Dispense Refill   amLODipine (NORVASC) 10 MG tablet TAKE 1 TABLET(10 MG) BY MOUTH DAILY 90 tablet 1   metFORMIN (GLUCOPHAGE XR) 500 MG 24 hr tablet Take 1 tablet (500 mg total) by mouth daily with breakfast. 90 tablet 1   rosuvastatin (CRESTOR) 20 MG tablet Take 1 tablet (20 mg total) by mouth daily. 90 tablet 1   valsartan-hydrochlorothiazide (DIOVAN-HCT) 160-25 MG tablet Take 1 tablet by mouth daily. 90 tablet 3   No current facility-administered medications on file prior to visit.   Social History   Socioeconomic History   Marital status: Single    Spouse name: Not on file   Number of children: Not on file   Years of education: Not on file   Highest education level: Not on file  Occupational History   Not on file  Tobacco Use   Smoking status: Every Day    Types: Cigarettes   Smokeless tobacco: Never  Vaping Use   Vaping Use: Never used  Substance and Sexual Activity   Alcohol use: Yes    Comment: occasionally   Drug use: Never   Sexual activity: Yes    Partners: Female    Birth control/protection: None  Other Topics Concern   Not on file  Social History Narrative   Not on file   Social Determinants of Health   Financial Resource Strain: Not on file  Food Insecurity: Not on file  Transportation Needs: Not on file   Physical Activity: Not on file  Stress: Not on file  Social Connections: Not on file  Intimate Partner Violence: Not on file   Family History  Problem Relation Age of Onset   Hypertension Mother    Diabetes Mother      OBJECTIVE: Blood Pressure 126/81   Pulse 93   Height 5\' 6"  (1.676 m)   Oxygen Saturation 96%   Body Mass Index 35.51 kg/m   Physical exam: General: Vital signs reviewed.  Patient is well-developed and well-nourished,  male in no acute distress and cooperative with exam. Head: Normocephalic and atraumatic. Eyes: EOMI, conjunctivae normal, no scleral icterus. Neck: Supple, trachea midline, normal ROM, no JVD, masses, thyromegaly, or carotid bruit present. Cardiovascular: RRR, S1 normal, S2 normal, no murmurs, gallops, or rubs. Pulmonary/Chest: Clear to auscultation bilaterally, no wheezes, rales, or rhonchi. Abdominal: Soft, non-tender, non-distended, BS +, no masses, organomegaly, or guarding present. Musculoskeletal: No joint deformities, erythema, or stiffness, ROM full and nontender. Extremities: No lower extremity edema bilaterally,  pulses symmetric and intact bilaterally. No cyanosis or clubbing. Neurological: A&O x3, Strength is normal Skin: Warm, dry and intact. No rashes or erythema. Psychiatric: Normal mood and affect. speech and behavior is normal. Cognition and memory are normal.     ROS Comprehensive ROS Pertinent positive and negative noted in HPI   Last 3 Office BP readings: BP Readings from Last 3 Encounters:  11/14/22 126/81  09/16/22 (Abnormal) 148/92  03/18/22 124/82    BMET  Component Value Date/Time   NA 140 09/16/2022 0907   K 4.6 09/16/2022 0907   CL 101 09/16/2022 0907   CO2 22 09/16/2022 0907   GLUCOSE 113 (H) 09/16/2022 0907   GLUCOSE 96 11/18/2020 0800   BUN 10 09/16/2022 0907   CREATININE 0.95 09/16/2022 0907   CALCIUM 10.1 09/16/2022 0907   GFRNONAA >60 11/18/2020 0800   GFRAA 116 10/18/2019 1054    Renal  function: CrCl cannot be calculated (Patient's most recent lab result is older than the maximum 21 days allowed.).  Clinical ASCVD: Yes  The 10-year ASCVD risk score (Arnett DK, et al., 2019) is: 18%   Values used to calculate the score:     Age: 68 years     Sex: Male     Is Non-Hispanic African American: Yes     Diabetic: Yes     Tobacco smoker: Yes     Systolic Blood Pressure: 126 mmHg     Is BP treated: Yes     HDL Cholesterol: 52 mg/dL     Total Cholesterol: 238 mg/dL  ASCVD risk factors include- [place]   ASSESSMENT & PLAN:  Diagnoses and all orders for this visit:  Essential hypertension BP is at goal less than 130/80 today blood pressure was 126/81 -Counseled on lifestyle modifications for blood pressure control including reduced dietary sodium, increased exercise, weight reduction and adequate sleep. Also, educated patient about the risk for cardiovascular events, stroke and heart attack. Also counseled patient about the importance of medication adherence. If you participate in smoking, it is important to stop using tobacco as this will increase the risks associated with uncontrolled blood pressure.  Minimize salt intake. Minimize alcohol intake Continue valsartan hydrochlorothiazide 160/25 and amlodipine 10 mg both taken daily   This note has been created with Education officer, environmental. Any transcriptional errors are unintentional.   Grayce Sessions, NP 11/19/2022, 9:37 PM

## 2023-01-29 ENCOUNTER — Other Ambulatory Visit (HOSPITAL_BASED_OUTPATIENT_CLINIC_OR_DEPARTMENT_OTHER): Payer: Self-pay

## 2023-02-09 ENCOUNTER — Other Ambulatory Visit (INDEPENDENT_AMBULATORY_CARE_PROVIDER_SITE_OTHER): Payer: Self-pay | Admitting: Primary Care

## 2023-02-09 DIAGNOSIS — Z760 Encounter for issue of repeat prescription: Secondary | ICD-10-CM

## 2023-02-09 DIAGNOSIS — I10 Essential (primary) hypertension: Secondary | ICD-10-CM

## 2023-04-01 ENCOUNTER — Other Ambulatory Visit (INDEPENDENT_AMBULATORY_CARE_PROVIDER_SITE_OTHER): Payer: Self-pay | Admitting: Primary Care

## 2023-04-01 DIAGNOSIS — E119 Type 2 diabetes mellitus without complications: Secondary | ICD-10-CM

## 2023-04-01 DIAGNOSIS — Z760 Encounter for issue of repeat prescription: Secondary | ICD-10-CM

## 2023-04-01 NOTE — Telephone Encounter (Signed)
Requested medications are due for refill today.  yes  Requested medications are on the active medications list.  yes  Last refill. 09/17/2022 #90 1 rf  Future visit scheduled.   yes  Notes to clinic.  Missing labs.    Requested Prescriptions  Pending Prescriptions Disp Refills   metFORMIN (GLUCOPHAGE-XR) 500 MG 24 hr tablet [Pharmacy Med Name: METFORMIN ER 500MG  24HR TABS] 90 tablet 1    Sig: TAKE 1 TABLET(500 MG) BY MOUTH DAILY WITH BREAKFAST     Endocrinology:  Diabetes - Biguanides Failed - 04/01/2023  8:26 AM      Failed - HBA1C is between 0 and 7.9 and within 180 days    Hgb A1c MFr Bld  Date Value Ref Range Status  09/16/2022 6.7 (H) 4.8 - 5.6 % Final    Comment:             Prediabetes: 5.7 - 6.4          Diabetes: >6.4          Glycemic control for adults with diabetes: <7.0          Failed - B12 Level in normal range and within 720 days    No results found for: "VITAMINB12"       Failed - CBC within normal limits and completed in the last 12 months    WBC  Date Value Ref Range Status  09/16/2022 4.8 3.4 - 10.8 x10E3/uL Final  11/18/2020 4.4 4.0 - 10.5 K/uL Final   RBC  Date Value Ref Range Status  09/16/2022 5.42 4.14 - 5.80 x10E6/uL Final  11/18/2020 4.57 4.22 - 5.81 MIL/uL Final   Hemoglobin  Date Value Ref Range Status  09/16/2022 15.4 13.0 - 17.7 g/dL Final   Hematocrit  Date Value Ref Range Status  09/16/2022 46.5 37.5 - 51.0 % Final   MCHC  Date Value Ref Range Status  09/16/2022 33.1 31.5 - 35.7 g/dL Final  54/07/8118 14.7 30.0 - 36.0 g/dL Final   [facility]  Date Value Ref Range Status  09/16/2022 28.4 26.6 - 33.0 pg Final  11/18/2020 30.0 26.0 - 34.0 pg Final   MCV  Date Value Ref Range Status  09/16/2022 86 79 - 97 fL Final   No results found for: "PLTCOUNTKUC", "LABPLAT", "POCPLA" RDW  Date Value Ref Range Status  09/16/2022 13.3 11.6 - 15.4 % Final         Passed - Cr in normal range and within 360 days    Creatinine, Ser  Date  Value Ref Range Status  09/16/2022 0.95 0.76 - 1.27 mg/dL Final         Passed - eGFR in normal range and within 360 days    GFR calc Af Amer  Date Value Ref Range Status  10/18/2019 116 >59 mL/min/1.73 Final   GFR, Estimated  Date Value Ref Range Status  11/18/2020 >60 >60 mL/min Final    Comment:    (NOTE) Calculated using the CKD-EPI Creatinine Equation (2021)    eGFR  Date Value Ref Range Status  09/16/2022 102 >59 mL/min/1.73 Final         Passed - Valid encounter within last 6 months    Recent Outpatient Visits           4 months ago Essential hypertension   [place] [facility] Grayce Sessions, NP   6 months ago Type 2 diabetes mellitus without complication, without long-term current use of insulin (HCC)   [place]  [facility] Grayce Sessions, NP   1 year ago Type 2 diabetes mellitus without complication, without long-term current use of insulin [facility])   [place] [facility] Grayce Sessions, NP   1 year ago Screening for diabetes mellitus   [place] [facility] Grayce Sessions, NP   2 years ago Tobacco abuse   [place] [facility] Grayce Sessions, NP       Future Appointments             In 5 months Randa Evens, Kinnie Scales, NP [place] [facility]

## 2023-06-24 ENCOUNTER — Other Ambulatory Visit (INDEPENDENT_AMBULATORY_CARE_PROVIDER_SITE_OTHER): Payer: Self-pay | Admitting: Primary Care

## 2023-06-24 DIAGNOSIS — I10 Essential (primary) hypertension: Secondary | ICD-10-CM

## 2023-06-24 DIAGNOSIS — Z760 Encounter for issue of repeat prescription: Secondary | ICD-10-CM

## 2023-08-17 ENCOUNTER — Other Ambulatory Visit (INDEPENDENT_AMBULATORY_CARE_PROVIDER_SITE_OTHER): Payer: Self-pay | Admitting: Primary Care

## 2023-08-17 DIAGNOSIS — E119 Type 2 diabetes mellitus without complications: Secondary | ICD-10-CM

## 2023-08-17 DIAGNOSIS — Z760 Encounter for issue of repeat prescription: Secondary | ICD-10-CM

## 2023-08-19 NOTE — Telephone Encounter (Signed)
Requested Prescriptions  Pending Prescriptions Disp Refills   metFORMIN (GLUCOPHAGE-XR) 500 MG 24 hr tablet [Pharmacy Med Name: METFORMIN ER 500MG  24HR TABS] 90 tablet 0    Sig: TAKE 1 TABLET(500 MG) BY MOUTH DAILY WITH BREAKFAST     Endocrinology:  Diabetes - Biguanides Failed - 08/17/2023  8:03 AM      Failed - HBA1C is between 0 and 7.9 and within 180 days    Hgb A1c MFr Bld  Date Value Ref Range Status  09/16/2022 6.7 (H) 4.8 - 5.6 % Final    Comment:             Prediabetes: 5.7 - 6.4          Diabetes: >6.4          Glycemic control for adults with diabetes: <7.0          Failed - B12 Level in normal range and within 720 days    No results found for: "VITAMINB12"       Failed - Valid encounter within last 6 months    Recent Outpatient Visits           9 months ago Essential hypertension   [place] [facility] Grayce Sessions, NP   11 months ago Type 2 diabetes mellitus without complication, without long-term current use of insulin (HCC)   [place] [facility] Grayce Sessions, NP   1 year ago Type 2 diabetes mellitus without complication, without long-term current use of insulin (HCC)   [place] [facility] Grayce Sessions, NP   1 year ago Screening for diabetes mellitus   [place] [facility] Grayce Sessions, NP   2 years ago Tobacco abuse   [place] [facility] Grayce Sessions, NP       Future Appointments             In 3 weeks Randa Evens, Kinnie Scales, NP [place] [facility]            Failed - CBC within normal limits and completed in the last 12 months    WBC  Date Value Ref Range Status  09/16/2022 4.8 3.4 - 10.8 x10E3/uL Final  11/18/2020 4.4 4.0 - 10.5 K/uL Final   RBC  Date Value Ref Range Status  09/16/2022 5.42 4.14 - 5.80 x10E6/uL Final  11/18/2020 4.57 4.22 - 5.81 MIL/uL Final   Hemoglobin  Date Value Ref Range  Status  09/16/2022 15.4 13.0 - 17.7 g/dL Final   Hematocrit  Date Value Ref Range Status  09/16/2022 46.5 37.5 - 51.0 % Final   MCHC  Date Value Ref Range Status  09/16/2022 33.1 31.5 - 35.7 g/dL Final  95/18/8416 60.6 30.0 - 36.0 g/dL Final   [facility]  Date Value Ref Range Status  09/16/2022 28.4 26.6 - 33.0 pg Final  11/18/2020 30.0 26.0 - 34.0 pg Final   MCV  Date Value Ref Range Status  09/16/2022 86 79 - 97 fL Final   No results found for: "PLTCOUNTKUC", "LABPLAT", "POCPLA" RDW  Date Value Ref Range Status  09/16/2022 13.3 11.6 - 15.4 % Final         Passed - Cr in normal range and within 360 days    Creatinine, Ser  Date Value Ref Range Status  09/16/2022 0.95 0.76 - 1.27 mg/dL Final         Passed - eGFR in normal range and within 360 days  GFR calc Af Amer  Date Value Ref Range Status  10/18/2019 116 >59 mL/min/1.73 Final   GFR, Estimated  Date Value Ref Range Status  11/18/2020 >60 >60 mL/min Final    Comment:    (NOTE) Calculated using the CKD-EPI Creatinine Equation (2021)    eGFR  Date Value Ref Range Status  09/16/2022 102 >59 mL/min/1.73 Final

## 2023-08-28 ENCOUNTER — Ambulatory Visit (INDEPENDENT_AMBULATORY_CARE_PROVIDER_SITE_OTHER): Payer: Self-pay | Admitting: Primary Care

## 2023-08-28 ENCOUNTER — Encounter (INDEPENDENT_AMBULATORY_CARE_PROVIDER_SITE_OTHER): Payer: Self-pay | Admitting: Primary Care

## 2023-08-28 VITALS — BP 128/82 | HR 100 | Resp 16 | Wt 233.8 lb

## 2023-08-28 DIAGNOSIS — Z09 Encounter for follow-up examination after completed treatment for conditions other than malignant neoplasm: Secondary | ICD-10-CM

## 2023-08-28 DIAGNOSIS — Z7984 Long term (current) use of oral hypoglycemic drugs: Secondary | ICD-10-CM

## 2023-08-28 DIAGNOSIS — E119 Type 2 diabetes mellitus without complications: Secondary | ICD-10-CM

## 2023-08-28 DIAGNOSIS — Z760 Encounter for issue of repeat prescription: Secondary | ICD-10-CM

## 2023-08-28 LAB — POCT GLYCOSYLATED HEMOGLOBIN (HGB A1C): HbA1c, POC (controlled diabetic range): 7.2 % — AB (ref 0.0–7.0)

## 2023-08-28 NOTE — Progress Notes (Deleted)
Subjective:  Patient ID: Steve Nelson, male    DOB: 1981-06-16  Age: 42 y.o. MRN: [id number]  CC: No chief complaint on file.   HPI Lymon Kidney presents forFollow-up of diabetes. Patient does not check blood sugar at home  Compliant with meds - {yes no free text:20080} Checking CBGs? {yes no free text:20080}  Fasting avg -   Postprandial average -  Exercising regularly? - {yes no free text:20080} Watching carbohydrate intake? - {yes no free text:20080} Neuropathy ? - {yes no free text:20080} Hypoglycemic events - {yes no free text:20080}  - Recovers with :   Pertinent ROS:  Polyuria - {yes no free text:20080} Polydipsia - {yes no free text:20080} Vision problems - {yes no free text:20080}  Medications as noted below. Taking them regularly without complication/adverse reaction being reported today.   History Icholas has no past medical history on file.   He has no past surgical history on file.   His family history includes Diabetes in his mother; Hypertension in his mother.He reports that he has been smoking cigarettes. He has never used smokeless tobacco. He reports current alcohol use. He reports that he does not use drugs.  Current Outpatient Medications on File Prior to Visit  Medication Sig Dispense Refill   amLODipine (NORVASC) 10 MG tablet TAKE 1 TABLET(10 MG) BY MOUTH DAILY 90 tablet 1   metFORMIN (GLUCOPHAGE-XR) 500 MG 24 hr tablet TAKE 1 TABLET(500 MG) BY MOUTH DAILY WITH BREAKFAST 90 tablet 0   rosuvastatin (CRESTOR) 20 MG tablet Take 1 tablet (20 mg total) by mouth daily. 90 tablet 1   valsartan-hydrochlorothiazide (DIOVAN-HCT) 160-25 MG tablet Take 1 tablet by mouth daily. 90 tablet 3   No current facility-administered medications on file prior to visit.    ROS Review of Systems  Objective:  There were no vitals taken for this visit.  BP Readings from Last 3 Encounters:  11/14/22 126/81  09/16/22 (!) 148/92  03/18/22 124/82    Wt Readings  from Last 3 Encounters:  09/16/22 220 lb (99.8 kg)  03/18/22 226 lb 6.4 oz (102.7 kg)  12/17/21 225 lb 9.6 oz (102.3 kg)    Physical Exam  Lab Results  Component Value Date   HGBA1C 6.7 (H) 09/16/2022   HGBA1C 6.5 (A) 03/18/2022   HGBA1C 6.7 (A) 12/17/2021    Lab Results  Component Value Date   WBC 4.8 09/16/2022   HGB 15.4 09/16/2022   HCT 46.5 09/16/2022   PLT 380 09/16/2022   GLUCOSE 113 (H) 09/16/2022   CHOL 238 (H) 03/18/2022   TRIG 121 03/18/2022   HDL 52 03/18/2022   LDLCALC 164 (H) 03/18/2022   ALT 33 09/16/2022   AST 23 09/16/2022   NA 140 09/16/2022   K 4.6 09/16/2022   CL 101 09/16/2022   CREATININE 0.95 09/16/2022   BUN 10 09/16/2022   CO2 22 09/16/2022   TSH 1.230 10/18/2019   HGBA1C 6.7 (H) 09/16/2022     Assessment & Plan:   Diagnoses and all orders for this visit:  Type 2 diabetes mellitus without complication, without long-term current use of insulin (HCC) -     POCT glycosylated hemoglobin (Hb A1C)   I am having Robley Fries maintain his amLODipine, rosuvastatin, valsartan-hydrochlorothiazide, and metFORMIN.  No orders of the defined types were placed in this encounter.    Follow-up:   No follow-ups on file.  The above assessment and management plan was discussed with the patient. The patient verbalized understanding of and has agreed  to the management plan. Patient is aware to call the clinic if symptoms fail to improve or worsen. Patient is aware when to return to the clinic for a follow-up visit. Patient educated on when it is appropriate to go to the emergency department.   Gwinda Passe, NP-C

## 2023-08-28 NOTE — Progress Notes (Signed)
[facility]   Subjective:   Steve Nelson is a 42 y.o. male presents for hospital follow up . Presented to the [facility] in [place].  He report he was not feeling well for several days and unable to eat anything.  He continued to work Environmental manager and was drinking energy drinks that made him feel worse.  He states that he thought he was by sparkling water and found out he was actually drinking seltza that had 200 mg of caffeine in each bottle.  He was drinking 2 days a day.  When he was discharged they placed a heart monitor on him.  He is unclear who he is supposed to follow-up regarding this monitor they gave him 1 box and he has 1 lab which last week.  Actually really unclear because explanation was not giving or any follow-up instructions therefore we will be referring him to cardiology for further workup and concerns.  He was also started on prednisone Dosepak which inform him can increase his blood sugars.  Diagnosed and discharged with viral syndrome.  Patient admits that during this time he was out of it so unclear about what he is to do next treated for palpitations and bronchitis.  Today he is not feeling any better still feels fatigue and lightheaded. Patient has No headache, No chest pain, No abdominal pain - No Nausea, No new weakness tingling or numbness, No Cough - shortness of breath   No past medical history on file.   No Known Allergies    Current Outpatient Medications on File Prior to Visit  Medication Sig Dispense Refill   amLODipine (NORVASC) 10 MG tablet TAKE 1 TABLET(10 MG) BY MOUTH DAILY 90 tablet 1   metFORMIN (GLUCOPHAGE-XR) 500 MG 24 hr tablet TAKE 1 TABLET(500 MG) BY MOUTH DAILY WITH BREAKFAST 90 tablet 0   rosuvastatin (CRESTOR) 20 MG tablet Take 1 tablet (20 mg total) by mouth daily. 90 tablet 1   valsartan-hydrochlorothiazide (DIOVAN-HCT) 160-25 MG tablet Take 1 tablet by mouth daily. 90 tablet 3   No current  facility-administered medications on file prior to visit.     Review of System: Comprehensive ROS Pertinent positive and negative noted in HPI    Objective:  BP 128/82   Pulse 100   Resp 16   Wt 233 lb 12.8 oz (106.1 kg)   SpO2 97%   BMI 37.74 kg/m   Filed Weights   08/28/23 1628  Weight: 233 lb 12.8 oz (106.1 kg)    Physical Exam: General Appearance: Well nourished, in no apparent distress. Eyes: PERRLA, EOMs, conjunctiva no swelling or erythema Sinuses: No Frontal/maxillary tenderness ENT/Mouth: Ext aud canals clear, TMs without erythema, bulging. No erythema, swelling, or exudate on post pharynx.  Tonsils not swollen or erythematous. Hearing normal.  Neck: Supple, thyroid normal.  Respiratory: Respiratory effort normal, BS equal bilaterally without rales, rhonchi, wheezing or stridor.  Cardio: RRR with no MRGs. Brisk peripheral pulses without edema.  Abdomen: Soft, + BS.  Non tender, no guarding, rebound, hernias, masses. Lymphatics: Non tender without lymphadenopathy.  Musculoskeletal: Full ROM, 5/5 strength, normal gait.  Skin: Warm, dry without rashes, lesions, ecchymosis.  Neuro: Cranial nerves intact. Normal muscle tone, no cerebellar symptoms. Sensation intact.  Psych: Awake and oriented X 3, normal affect, Insight and Judgment appropriate.    Assessment:  [name] was seen today for hospitalization follow-up, diabetes and hypertension.  Diagnoses and all orders for this visit:  Type 2 diabetes mellitus without complication,  without long-term current use of insulin (HCC) -     POCT glycosylated hemoglobin (Hb A1C) 7,2 increased metformin to BID  Hospital discharge follow-up Patient had a heart monitor on and had no clue or instruction Dr. Nadara Eaton called for advised placed for palpitation ask type name on box provided . Envelope also in box patient was/is suppose to send reading via monitor through mail. Patient states he was out of it and really was not doing what  he was told. Told patient start with reading the pamphlet that is provided in the box . When information return at that time review for cardiology referral    This note has been created with Dragon speech recognition software and Paediatric nurse. Any transcriptional errors are unintentional.   Grayce Sessions, NP 08/28/2023, 4:39 PM

## 2023-09-01 ENCOUNTER — Ambulatory Visit (INDEPENDENT_AMBULATORY_CARE_PROVIDER_SITE_OTHER): Payer: Self-pay | Admitting: *Deleted

## 2023-09-01 NOTE — Telephone Encounter (Signed)
Will forward to provider  

## 2023-09-01 NOTE — Telephone Encounter (Signed)
  Chief Complaint: elevated BP with sx and takes medication  Symptoms: BP 161/97 right arm beginning of call with NT . Reports has been feeling lightheaded, sluggish, blurry vision at times. Slight headaches. Denies missing any medications. Does not have monitor to check blood sugar.  Reports balance off at times. Has had fried chicken this am and just ate mushrooms that may be high in sodium. Rechecked BP 132/84 in right arm. Rechecked in left arm for 105/65. Frequency na  Pertinent Negatives: Patient denies chest pain no difficulty breathing no fever, no weakness , N/T on either side of body.  Disposition: [] ED /[] Urgent Care (no appt availability in office) / [] Appointment(In office/virtual)/ []  [place] [facility]/ [] Home Care/ [] Refused Recommended Disposition /[]  Mobile Bus/ [x]  Follow-up with PCP Additional Notes:   Recommended patient increase water intake and take a short brisk walk as long as he is no longer lightheaded or unbalanced.  Recheck BP and keep log of BP for PCP. If able to check blood glucose do. Monitor sodium intake and diet. Patient reports he is going to try to drink small amount of apple cidar vinegar and recheck BP. Recommended if sx continue or worsen and BP elevated go to ED. Patient verbalized understanding . Please advise  . Appt already scheduled for 09/15/23.   Summary: lightheaded/sluggish   Patient stated he had been taking prednisone and he took his last pill on Thursday but since then he has been feeling lighheaded and sluggish. He was in the hospital 9/27 and that is when it all started. He took his blood pressure on yesterday and it was 150/90. Please f/u with patient            Reason for Disposition  [1] Taking BP medications AND [2] feels is having side effects (e.g., impotence, cough, dizzy upon standing)  Answer Assessment - Initial Assessment Questions 1. BLOOD PRESSURE: "What is the blood pressure?" "Did you take at least two  measurements 5 minutes apart?"     BP 161/97 right arm  and rechecked for BP 132/84 right arm and rechecked in left arm for 105/65 2. ONSET: "When did you take your blood pressure?"     Now  3. HOW: "How did you take your blood pressure?" (e.g., automatic home BP monitor, visiting nurse)     Automatic BP at home monitor  4. HISTORY: "Do you have a history of high blood pressure?"     Yes 5. MEDICINES: "Are you taking any medicines for blood pressure?" "Have you missed any doses recently?"     Yes has not missed any doses 6. OTHER SYMPTOMS: "Do you have any symptoms?" (e.g., blurred vision, chest pain, difficulty breathing, headache, weakness)     Lightheaded. Sluggish feeling , off balance , blurred vision slight headache  7. PREGNANCY: "Is there any chance you are pregnant?" "When was your last menstrual period?"     na  Protocols used: Blood Pressure - High-A-AH

## 2023-09-02 MED ORDER — METFORMIN HCL ER 500 MG PO TB24
500.0000 mg | ORAL_TABLET | Freq: Two times a day (BID) | ORAL | 1 refills | Status: DC
Start: 2023-09-02 — End: 2023-12-11

## 2023-09-02 NOTE — Telephone Encounter (Signed)
Pt has an appt on 10/21 do u want a sooner appt if available

## 2023-09-15 ENCOUNTER — Ambulatory Visit (INDEPENDENT_AMBULATORY_CARE_PROVIDER_SITE_OTHER): Payer: Self-pay | Admitting: Primary Care

## 2023-09-15 ENCOUNTER — Encounter (INDEPENDENT_AMBULATORY_CARE_PROVIDER_SITE_OTHER): Payer: Self-pay | Admitting: Primary Care

## 2023-09-15 VITALS — BP 129/87 | HR 105 | Resp 16 | Wt 234.2 lb

## 2023-09-15 DIAGNOSIS — E119 Type 2 diabetes mellitus without complications: Secondary | ICD-10-CM

## 2023-09-15 DIAGNOSIS — Z7984 Long term (current) use of oral hypoglycemic drugs: Secondary | ICD-10-CM

## 2023-09-15 DIAGNOSIS — Z2821 Immunization not carried out because of patient refusal: Secondary | ICD-10-CM

## 2023-09-15 LAB — GLUCOSE, POCT (MANUAL RESULT ENTRY): POC Glucose: 297 mg/dL — AB (ref 70–99)

## 2023-09-15 NOTE — Progress Notes (Signed)
[facility]  Steve Nelson, is a 42 y.o. male  WGN:[id number]  HQI:[id number]  DOB - Oct 09, 1981  Chief Complaint  Patient presents with   Hypertension       Subjective:   Steve Nelson is a 42 y.o. male here today for a follow up visit HTN and T2D. Patient has No headache, No chest pain, No abdominal pain - No Nausea, No new weakness tingling or numbness, No Cough - shortness of breath. Denies polyuria, polydipsia, polyphasia or vision changes.  Does not check blood sugars at home. He is not feeling well dicussed underlying factors cold [place] with alcohol and smoking. The body is trying to regulate this sudden change  No problems updated.  No Known Allergies  No past medical history on file.  Current Outpatient Medications on File Prior to Visit  Medication Sig Dispense Refill   amLODipine (NORVASC) 10 MG tablet TAKE 1 TABLET(10 MG) BY MOUTH DAILY 90 tablet 1   metFORMIN (GLUCOPHAGE-XR) 500 MG 24 hr tablet Take 1 tablet (500 mg total) by mouth 2 (two) times daily with a meal. 180 tablet 1   rosuvastatin (CRESTOR) 20 MG tablet Take 1 tablet (20 mg total) by mouth daily. 90 tablet 1   valsartan-hydrochlorothiazide (DIOVAN-HCT) 160-25 MG tablet Take 1 tablet by mouth daily. 90 tablet 3   No current facility-administered medications on file prior to visit.    Objective:   Vitals:   09/15/23 1029  BP: 129/87  Pulse: (!) 105  Resp: 16  SpO2: 96%  Weight: 234 lb 3.2 oz (106.2 kg)    Comprehensive ROS Pertinent positive and negative noted in HPI   Exam General appearance : Awake, alert, not in any distress. Speech Clear. Not toxic looking HEENT: Atraumatic and Normocephalic, pupils equally reactive to light and accomodation Neck: Supple, no JVD. No cervical lymphadenopathy.  Chest: Good air entry bilaterally, no added sounds  CVS: S1 S2 regular, no murmurs.  Abdomen: Bowel sounds present, Non tender and not distended with no gaurding, rigidity or  rebound. Extremities: B/L Lower Ext shows no edema, both legs are warm to touch Neurology: Awake alert, and oriented X 3,  Non focal Skin: No Rash  Data Review Lab Results  Component Value Date   HGBA1C 7.2 (A) 08/28/2023   HGBA1C 6.7 (H) 09/16/2022   HGBA1C 6.5 (A) 03/18/2022    Assessment & Plan   Steve Nelson was seen today for hypertension.  Diagnoses and all orders for this visit:  Type 2 diabetes mellitus without complication, without long-term current use of insulin (HCC) -     POCT glucose (manual entry) - educated on lifestyle modifications, including but not limited to diet choices and adding exercise to daily routine.   -     Lipid Panel  Influenza vaccination declined  Tetanus, diphtheria, and acellular pertussis (Tdap) vaccination declined     Patient have been counseled extensively about nutrition and exercise. Other issues discussed during this visit include: low cholesterol diet, weight control and daily exercise, foot care, annual eye examinations at Ophthalmology, importance of adherence with medications and regular follow-up. We also discussed long term complications of uncontrolled diabetes and hypertension.   No follow-ups on file.  The patient was given clear instructions to go to ER or return to medical center if symptoms don't improve, worsen or new problems develop. The patient verbalized understanding. The patient was told to call to get lab results if they haven't heard anything in the next week.   This note has  been created with Education officer, environmental. Any transcriptional errors are unintentional.   Grayce Sessions, NP 09/15/2023, 11:11 AM

## 2023-09-22 ENCOUNTER — Telehealth (INDEPENDENT_AMBULATORY_CARE_PROVIDER_SITE_OTHER): Payer: Self-pay | Admitting: Primary Care

## 2023-09-22 NOTE — Telephone Encounter (Signed)
Pt is calling in because he saw Marcelino Duster on 10/21 and says he brought in a paper with the results from his heart that Marcelino Duster was supposed to fax. Pt is calling in to see if the paperwork was faxed and what the results were. Please follow up with pt.

## 2023-09-23 NOTE — Telephone Encounter (Signed)
Returned pt call and made aware that we have not received results as of yet but once we do provider will reach out

## 2023-09-24 NOTE — Telephone Encounter (Signed)
Called to remind pt about apt

## 2023-09-25 ENCOUNTER — Encounter (HOSPITAL_BASED_OUTPATIENT_CLINIC_OR_DEPARTMENT_OTHER): Payer: Self-pay

## 2023-09-25 ENCOUNTER — Other Ambulatory Visit: Payer: Self-pay

## 2023-09-25 ENCOUNTER — Emergency Department (HOSPITAL_BASED_OUTPATIENT_CLINIC_OR_DEPARTMENT_OTHER): Payer: Medicaid Other | Admitting: Radiology

## 2023-09-25 ENCOUNTER — Ambulatory Visit (INDEPENDENT_AMBULATORY_CARE_PROVIDER_SITE_OTHER): Payer: Self-pay | Admitting: Primary Care

## 2023-09-25 ENCOUNTER — Encounter (INDEPENDENT_AMBULATORY_CARE_PROVIDER_SITE_OTHER): Payer: Self-pay | Admitting: Primary Care

## 2023-09-25 ENCOUNTER — Other Ambulatory Visit (INDEPENDENT_AMBULATORY_CARE_PROVIDER_SITE_OTHER): Payer: Self-pay | Admitting: Primary Care

## 2023-09-25 ENCOUNTER — Emergency Department (HOSPITAL_BASED_OUTPATIENT_CLINIC_OR_DEPARTMENT_OTHER)
Admission: EM | Admit: 2023-09-25 | Discharge: 2023-09-25 | Disposition: A | Discharge status: Home / Self Care | Payer: Medicaid Other | Attending: Emergency Medicine | Admitting: Emergency Medicine

## 2023-09-25 ENCOUNTER — Emergency Department (HOSPITAL_BASED_OUTPATIENT_CLINIC_OR_DEPARTMENT_OTHER): Payer: Medicaid Other

## 2023-09-25 VITALS — BP 142/99 | HR 96 | Resp 16 | Wt 235.8 lb

## 2023-09-25 DIAGNOSIS — R079 Chest pain, unspecified: Secondary | ICD-10-CM | POA: Insufficient documentation

## 2023-09-25 DIAGNOSIS — I10 Essential (primary) hypertension: Secondary | ICD-10-CM

## 2023-09-25 DIAGNOSIS — R42 Dizziness and giddiness: Secondary | ICD-10-CM | POA: Insufficient documentation

## 2023-09-25 DIAGNOSIS — R0789 Other chest pain: Secondary | ICD-10-CM | POA: Diagnosis not present

## 2023-09-25 DIAGNOSIS — Z760 Encounter for issue of repeat prescription: Secondary | ICD-10-CM

## 2023-09-25 DIAGNOSIS — Z1159 Encounter for screening for other viral diseases: Secondary | ICD-10-CM

## 2023-09-25 DIAGNOSIS — H538 Other visual disturbances: Secondary | ICD-10-CM | POA: Diagnosis not present

## 2023-09-25 DIAGNOSIS — R000 Tachycardia, unspecified: Secondary | ICD-10-CM | POA: Diagnosis not present

## 2023-09-25 DIAGNOSIS — E1169 Type 2 diabetes mellitus with other specified complication: Secondary | ICD-10-CM

## 2023-09-25 DIAGNOSIS — R739 Hyperglycemia, unspecified: Secondary | ICD-10-CM

## 2023-09-25 DIAGNOSIS — K219 Gastro-esophageal reflux disease without esophagitis: Secondary | ICD-10-CM

## 2023-09-25 DIAGNOSIS — E1165 Type 2 diabetes mellitus with hyperglycemia: Secondary | ICD-10-CM | POA: Diagnosis not present

## 2023-09-25 LAB — BASIC METABOLIC PANEL
Anion gap: 10 (ref 5–15)
BUN: 14 mg/dL (ref 6–20)
CO2: 23 mmol/L (ref 22–32)
Calcium: 10.1 mg/dL (ref 8.9–10.3)
Chloride: 101 mmol/L (ref 98–111)
Creatinine, Ser: 0.92 mg/dL (ref 0.61–1.24)
GFR, Estimated: >60 mL/min (ref >60)
Glucose, Bld: 274 mg/dL — ABNORMAL HIGH (ref 70–99)
Potassium: 4.1 mmol/L (ref 3.5–5.1)
Sodium: 134 mmol/L — ABNORMAL LOW (ref 135–145)

## 2023-09-25 LAB — CBC
HCT: 43.6 % (ref 39.0–52.0)
Hemoglobin: 14.7 g/dL (ref 13.0–17.0)
MCH: 28.4 pg (ref 26.0–34.0)
MCHC: 33.7 g/dL (ref 30.0–36.0)
MCV: 84.3 fL (ref 80.0–100.0)
Platelets: 392 10*3/uL (ref 150–400)
RBC: 5.17 MIL/uL (ref 4.22–5.81)
RDW: 13.4 % (ref 11.5–15.5)
WBC: 4.7 10*3/uL (ref 4.0–10.5)
nRBC: 0 % (ref 0.0–0.2)

## 2023-09-25 LAB — TROPONIN I (HIGH SENSITIVITY)
Troponin I (High Sensitivity): 5 ng/L (ref <18)
Troponin I (High Sensitivity): 6 ng/L (ref <18)

## 2023-09-25 LAB — D-DIMER, QUANTITATIVE: D-Dimer, Quant: <0.27 ug{FEU}/mL (ref 0.00–0.50)

## 2023-09-25 MED ORDER — ROSUVASTATIN CALCIUM 20 MG PO TABS: 20.0000 mg | ORAL_TABLET | Freq: Every day | ORAL | 0 refills | Status: DC

## 2023-09-25 MED ORDER — PANTOPRAZOLE SODIUM 40 MG PO TBEC: 40.0000 mg | DELAYED_RELEASE_TABLET | Freq: Every day | ORAL | 0 refills | Status: DC

## 2023-09-25 MED ORDER — ALUM & MAG HYDROXIDE-SIMETH 200-200-20 MG/5ML PO SUSP
30.0000 mL | Freq: Once | ORAL | Status: AC
  Administered 2023-09-25: 30 mL via ORAL
  Filled 2023-09-25: qty 30

## 2023-09-25 MED ORDER — VALSARTAN-HYDROCHLOROTHIAZIDE 160-25 MG PO TABS: 1.0000 | ORAL_TABLET | Freq: Every day | ORAL | 1 refills | Status: DC

## 2023-09-25 NOTE — Progress Notes (Signed)
  [facility]  Steve Nelson, is a 42 y.o. male  ZOX:[id number]  WJX:[id number]  DOB - 27-Jun-1981  Chief Complaint  Patient presents with   Blurred Vision   Dizziness   Chest Pain       Subjective:   Steve Nelson is a 42 y.o. male here today for a follow up visit. He sent information back to the hospital in [place] about heart monitor reading- no information to date. He c/o chest pain no radiation, fatigue, blurred vision and dizziness. He is out of Bp meds and Bp is elevated. Patient has No headache,  No abdominal pain - No Nausea, No new weakness tingling or numbness, No Cough - shortness of breath  No problems updated.  No Known Allergies  No past medical history on file.  Current Outpatient Medications on File Prior to Visit  Medication Sig Dispense Refill   amLODipine (NORVASC) 10 MG tablet TAKE 1 TABLET(10 MG) BY MOUTH DAILY 90 tablet 1   metFORMIN (GLUCOPHAGE-XR) 500 MG 24 hr tablet Take 1 tablet (500 mg total) by mouth 2 (two) times daily with a meal. 180 tablet 1   rosuvastatin (CRESTOR) 20 MG tablet Take 1 tablet (20 mg total) by mouth daily. 90 tablet 1   valsartan-hydrochlorothiazide (DIOVAN-HCT) 160-25 MG tablet Take 1 tablet by mouth daily. 90 tablet 3   No current facility-administered medications on file prior to visit.    Objective:   Vitals:   09/25/23 1119 09/25/23 1120  BP: (!) 143/95 (!) 142/99  Pulse: 96   Resp: 16   SpO2: 97%   Weight: 235 lb 12.8 oz (107 kg)     Comprehensive ROS Pertinent positive and negative noted in HPI   Exam General appearance : Awake, alert, not in any distress. Speech Clear. Not toxic looking HEENT: Atraumatic and Normocephalic, pupils equally reactive to light and accomodation Neck: Supple, no JVD. No cervical lymphadenopathy.  Chest: Good air entry bilaterally, no added sounds  CVS: S1 S2 regular, no murmurs.  Abdomen: Bowel sounds present, Non tender and not distended with no  gaurding, rigidity or rebound. Extremities: B/L Lower Ext shows no edema, both legs are warm to touch Neurology: Awake alert, and oriented X 3, CN II-XII intact, Non focal Skin: No Rash  Data Review Lab Results  Component Value Date   HGBA1C 7.2 (A) 08/28/2023   HGBA1C 6.7 (H) 09/16/2022   HGBA1C 6.5 (A) 03/18/2022    Assessment & Plan  Steve Nelson was seen today for blurred vision, dizziness and chest pain.  Diagnoses and all orders for this visit:  Chest pain, unspecified type Advised go to ED for evaluation Referral to cardiology    Patient have been counseled extensively about nutrition and exercise. Other issues discussed during this visit include: low cholesterol diet, weight control and daily exercise, foot care, annual eye examinations at Ophthalmology, importance of adherence with medications and regular follow-up. We also discussed long term complications of uncontrolled diabetes and hypertension.   No follow-ups on file.  The patient was given clear instructions to go to ER or return to medical center if symptoms don't improve, worsen or new problems develop. The patient verbalized understanding. The patient was told to call to get lab results if they haven't heard anything in the next week.   This note has been created with Education officer, environmental. Any transcriptional errors are unintentional.   Grayce Sessions, NP 09/25/2023, 11:27 AM

## 2023-09-25 NOTE — Telephone Encounter (Signed)
Refused hydrochlorothiazide 25 mg because it was discontinued 09/16/2022.

## 2023-09-25 NOTE — Telephone Encounter (Signed)
Requested Prescriptions  Pending Prescriptions Disp Refills   valsartan-hydrochlorothiazide (DIOVAN-HCT) 160-25 MG tablet 90 tablet 1    Sig: Take 1 tablet by mouth daily.     Cardiovascular: ARB + Diuretic Combos Failed - 09/25/2023  1:05 PM      Failed - K in normal range and within 180 days    Potassium  Date Value Ref Range Status  09/25/2023 4.1 3.5 - 5.1 mmol/L Final         Failed - Na in normal range and within 180 days    Sodium  Date Value Ref Range Status  09/25/2023 134 (L) 135 - 145 mmol/L Final  09/16/2022 140 134 - 144 mmol/L Final         Failed - Cr in normal range and within 180 days    Creatinine, Ser  Date Value Ref Range Status  09/25/2023 0.92 0.61 - 1.24 mg/dL Final         Failed - eGFR is 10 or above and within 180 days    GFR calc Af Amer  Date Value Ref Range Status  10/18/2019 116 >59 mL/min/1.73 Final   GFR, Estimated  Date Value Ref Range Status  09/25/2023 >60 >60 mL/min Final    Comment:    (NOTE) Calculated using the CKD-EPI Creatinine Equation (2021)    eGFR  Date Value Ref Range Status  09/16/2022 102 >59 mL/min/1.73 Final         Failed - Last BP in normal range    BP Readings from Last 1 Encounters:  09/25/23 (!) 137/98         Passed - Patient is not pregnant      Passed - Valid encounter within last 6 months    Recent Outpatient Visits           Today Chest pain, unspecified type   [place] [facility] Grayce Sessions, NP   1 week ago Type 2 diabetes mellitus without complication, without long-term current use of insulin (HCC)   [place] [facility] Grayce Sessions, NP   4 weeks ago Type 2 diabetes mellitus without complication, without long-term current use of insulin (HCC)   [place] [facility] Grayce Sessions, NP   10 months ago Essential hypertension   [place] [facility] Grayce Sessions, NP   1 year ago Type 2 diabetes  mellitus without complication, without long-term current use of insulin [facility])   [place] [facility] Grayce Sessions, NP       Future Appointments             In 2 months Randa Evens, Kinnie Scales, NP  [facility]

## 2023-09-25 NOTE — ED Triage Notes (Signed)
Pt advises he is coming from PCP/ "check up," c/o CP, continuous blurred vision/ lightheaded & dizziness. Hx of HTN, has not had HTN meds x2 days"  Denies N/V, SHOB, HA, radiation of pain

## 2023-09-25 NOTE — Telephone Encounter (Signed)
Medication Refill - Medication: valsartan-hydrochlorothiazide (DIOVAN-HCT) 160-25 MG tablet   Has the patient contacted their pharmacy? Yes.    (Agent: If yes, when and what did the pharmacy advise?) Contact PCP   Preferred Pharmacy (with phone number or street name): [facility] [place]   Has the patient been seen for an appointment in the last year OR does the patient have an upcoming appointment? Yes.    Agent: Please be advised that RX refills may take up to 3 business days. We ask that you follow-up with your pharmacy.

## 2023-09-25 NOTE — ED Provider Notes (Signed)
[place] EMERGENCY DEPARTMENT AT [facility] Provider Note   CSN: [id number] Arrival date & time: 09/25/23  1324     History  Chief Complaint  Patient presents with   Chest Pain   Dizziness    Steve Nelson is a 42 y.o. male with past medical history of high blood pressure, diabetes, alcohol abuse presenting to emergency room lightheadedness neck change in vision.  Patient reports very well today denies any current symptoms.  Patient denies exertional chest pain or exertional shortness of breath.  Lightheaded with positional changes standing up likely.  Patient describes chest pain as sharp shooting chest pain is not worse with movements or taking a deep breath then.  Chest pain starts at random and is sometimes worse with laying down flat or after eating.  Patient reports he has had a lot of acid reflux lately.  Patient has not tried any medications for his symptoms.  Patient was seen by primary care who recommended that he come here because of the continued symptoms.  Patient's been out of his hypertension medications for 2 days and lost insurance secondary to not going to work.  Patient is not been able to return to work because he is waiting for heart monitor interpretation by primary care. Denies palpations or racing heart rate.    Chest Pain Associated symptoms: dizziness   Dizziness Associated symptoms: chest pain        Home Medications Prior to Admission medications   Medication Sig Start Date End Date Taking? Authorizing Provider  amLODipine (NORVASC) 10 MG tablet TAKE 1 TABLET(10 MG) BY MOUTH DAILY 03/18/22   Grayce Sessions, NP  metFORMIN (GLUCOPHAGE-XR) 500 MG 24 hr tablet Take 1 tablet (500 mg total) by mouth 2 (two) times daily with a meal. 09/02/23   Grayce Sessions, NP  rosuvastatin (CRESTOR) 20 MG tablet Take 1 tablet (20 mg total) by mouth daily. 03/21/22   Grayce Sessions, NP  valsartan-hydrochlorothiazide (DIOVAN-HCT) 160-25 MG tablet Take  1 tablet by mouth daily. 09/25/23   Grayce Sessions, NP      Allergies    Patient has no known allergies.    Review of Systems   Review of Systems  Cardiovascular:  Positive for chest pain.  Neurological:  Positive for dizziness.    Physical Exam Updated Vital Signs BP (!) 137/98   Pulse (!) 101   Temp 98.4 F (36.9 C)   Resp 20   SpO2 96%  Physical Exam Vitals and nursing note reviewed.  Constitutional:      General: He is not in acute distress.    Appearance: He is not ill-appearing, toxic-appearing or diaphoretic.  HENT:     Head: Normocephalic and atraumatic.  Eyes:     General: No scleral icterus.    Extraocular Movements: Extraocular movements intact.     Conjunctiva/sclera: Conjunctivae normal.     Pupils: Pupils are equal, round, and reactive to light.  Cardiovascular:     Rate and Rhythm: Normal rate and regular rhythm.     Pulses: Normal pulses.     Heart sounds: Normal heart sounds. No murmur heard. Pulmonary:     Effort: Pulmonary effort is normal. No respiratory distress.     Breath sounds: Normal breath sounds.  Chest:     Chest wall: No tenderness.  Abdominal:     General: Abdomen is flat. Bowel sounds are normal.     Palpations: Abdomen is soft.     Tenderness: There is no abdominal  tenderness.  Musculoskeletal:     Right lower leg: No edema.     Left lower leg: No edema.  Skin:    General: Skin is warm and dry.     Findings: No lesion.  Neurological:     General: No focal deficit present.     Mental Status: He is alert and oriented to person, place, and time. Mental status is at baseline.     Cranial Nerves: No cranial nerve deficit.     Sensory: No sensory deficit.     Motor: No weakness.     Coordination: Coordination normal.     Gait: Gait normal.  Psychiatric:        Mood and Affect: Mood normal.        Behavior: Behavior normal.        Thought Content: Thought content normal.     ED Results / Procedures / Treatments    Labs (all labs ordered are listed, but only abnormal results are displayed) Labs Reviewed  BASIC METABOLIC PANEL - Abnormal; Notable for the following components:      Result Value   Sodium 134 (*)    Glucose, Bld 274 (*)    All other components within normal limits  CBC  TROPONIN I (HIGH SENSITIVITY)  TROPONIN I (HIGH SENSITIVITY)    EKG EKG Interpretation Date/Time:  Thursday September 25 2023 13:37:01 EDT Ventricular Rate:  106 PR Interval:  138 QRS Duration:  82 QT Interval:  330 QTC Calculation: 438 R Axis:   29  Text Interpretation: Sinus tachycardia Anterior infarct , age undetermined Abnormal ECG No previous ECGs available Confirmed by Elayne Snare ([id number]) on 09/25/2023 3:20:49 PM  Radiology DG Chest 2 View  Result Date: 09/25/2023 CLINICAL DATA:  Chest pain. EXAM: CHEST - 2 VIEW COMPARISON:  Chest radiograph 07/04/2021. FINDINGS: Clear lungs. Normal heart size and mediastinal contours. No pleural effusion or pneumothorax. Visualized bones and upper abdomen are unremarkable. IMPRESSION: No evidence of acute cardiopulmonary disease. Electronically Signed   By: Orvan Falconer M.D.   On: 09/25/2023 15:34   CT Head Wo Contrast  Result Date: 09/25/2023 CLINICAL DATA:  Diplopia, blurred vision and dizziness. EXAM: CT HEAD WITHOUT CONTRAST TECHNIQUE: Contiguous axial images were obtained from the base of the skull through the vertex without intravenous contrast. RADIATION DOSE REDUCTION: This exam was performed according to the departmental dose-optimization program which includes automated exposure control, adjustment of the mA and/or kV according to patient size and/or use of iterative reconstruction technique. COMPARISON:  Head CT 11/18/2020. FINDINGS: Brain: No acute intracranial hemorrhage. Gray-white differentiation is preserved. No hydrocephalus or extra-axial collection. No mass effect or midline shift. Vascular: No hyperdense vessel or unexpected calcification. Skull:  No calvarial fracture or suspicious bone lesion. Skull base is unremarkable. Sinuses/Orbits: No acute finding. Other: None. IMPRESSION: No acute intracranial abnormality. Electronically Signed   By: Orvan Falconer M.D.   On: 09/25/2023 15:33    Procedures Procedures    Medications Ordered in ED Medications - No data to display  ED Course/ Medical Decision Making/ A&P                                 Medical Decision Making Amount and/or Complexity of Data Reviewed Labs: ordered. Radiology: ordered.  Risk OTC drugs. Prescription drug management.   Steve Nelson 42 y.o. presented today for chest pain. Working DDx that I considered at this time includes, but not limited  to, ACS, GERD, pe, pna, aortic dissection, pneumothorax, MSK path, anemia, esophageal rupture, CHF exacerbation, valvular disorder, myocarditis, pericarditis, endocarditis, pericardial effusion/cardiac tamponade, pulmonary edema, gastritis/PUD, esophagitis.  R/o Dx: These are considered less likely due to history of present illness and physical exam findings.   Review of prior external notes: 09/25/2023 who recommended ER if symptoms do not improve   Unique Tests and My Interpretation:  EKG: Rate, rhythm, axis, intervals all examined: sinus tachycardia  Troponin: Neg, repeat NEg D-dimmer: NEG CXR: NEG Head Ct without intracranial pathology  CBC: NEG  BMP: glucose 274, no electrolyte abnormality   Problem List / ED Course / Critical interventions / Medication management  Patient reporting to emergency room with intermittent chest pain and lightheadedness.  Patient has no focal neurological deficits, ambulating with steady gait, no change in vision, answering questions appropriately.  Given negative head CT and reassuring physical exam findings.  I do not feel that this is related to intracranial abnormality.  Patient is also experiencing chest pain, patient had 2 negative troponins, EKG with sinus tachycardia,  negative chest x-ray and negative D-dimer.  Patient has not had any chest pain today and is not typical of chest pain.  Given that chest pain is burning in nature and associated with reflux, I have sent prescription for pantoprazole.  I would also like patient to follow-up with cardiology considering risk factors of diabetes, high blood pressure and hyperlipidemia.  Patient agrees and understands plan.  Patient given strict return precautions. Patient also requesting medication refill, I have resent Crestor for 30 days for cholesterol.  Patient also found to have hyperglycemia during stay.  I recommended that patient check random blood glucose at home and record measurements for primary care and consider rechecking A1c with medication changes for diabetes.  Patient family expressed understanding and agreed to plan. I ordered medication including Gi cocktail  for reflux    Patients vitals assessed. Upon arrival patient is hemodynamically stable.  I have reviewed the patients home medicines and have made adjustments as needed   Plan:  F/u w/ PCP to ensure resolution of sx.  Patient was given return precautions. Patient stable for discharge at this time.  Patient educated on sx/ dx and verbalized understanding of plan.  Will return to ER w/ new or worsening sx.          Final Clinical Impression(s) / ED Diagnoses Final diagnoses:  None    Rx / DC Orders ED Discharge Orders     None         Steve Nelson, Horald Chestnut, PA-C 09/25/23 1755    Rexford Maus, DO 09/25/23 2315

## 2023-09-25 NOTE — Discharge Instructions (Addendum)
You were seen in the emergency room today for chest pain.  Your workup came back reassuring.  You had negative troponin, negative D-dimer and negative chest x-ray.  I have sent you an ambulatory referral to cardiology.  Please follow-up for continued chest pain.    I would also like you to see primary care to ensure resolution of symptoms.  I have sent pantoprazole for your acid reflux.  Please take as prescribed. Please consider getting A1C recheck and rechecking blood glucose as your blood sugar is high today.   I have sent medication refill for Crestor which is a medication for cholesterol, further refills should come from primary care.   Please return to emergency room if you have any new or worsening symptoms.

## 2023-10-02 ENCOUNTER — Encounter (INDEPENDENT_AMBULATORY_CARE_PROVIDER_SITE_OTHER): Payer: Self-pay | Admitting: Primary Care

## 2023-10-25 DIAGNOSIS — H5213 Myopia, bilateral: Secondary | ICD-10-CM | POA: Diagnosis not present

## 2023-10-27 ENCOUNTER — Emergency Department (HOSPITAL_COMMUNITY): Payer: Medicaid Other

## 2023-10-27 ENCOUNTER — Encounter (HOSPITAL_COMMUNITY): Payer: Self-pay | Admitting: Emergency Medicine

## 2023-10-27 ENCOUNTER — Emergency Department (HOSPITAL_COMMUNITY)
Admission: EM | Admit: 2023-10-27 | Discharge: 2023-10-27 | Disposition: A | Discharge status: Home / Self Care | Payer: Medicaid Other | Attending: Emergency Medicine | Admitting: Emergency Medicine

## 2023-10-27 ENCOUNTER — Other Ambulatory Visit: Payer: Self-pay

## 2023-10-27 DIAGNOSIS — E871 Hypo-osmolality and hyponatremia: Secondary | ICD-10-CM | POA: Insufficient documentation

## 2023-10-27 DIAGNOSIS — Z7984 Long term (current) use of oral hypoglycemic drugs: Secondary | ICD-10-CM | POA: Diagnosis not present

## 2023-10-27 DIAGNOSIS — R0689 Other abnormalities of breathing: Secondary | ICD-10-CM | POA: Diagnosis not present

## 2023-10-27 DIAGNOSIS — I10 Essential (primary) hypertension: Secondary | ICD-10-CM | POA: Insufficient documentation

## 2023-10-27 DIAGNOSIS — R000 Tachycardia, unspecified: Secondary | ICD-10-CM | POA: Insufficient documentation

## 2023-10-27 DIAGNOSIS — I499 Cardiac arrhythmia, unspecified: Secondary | ICD-10-CM | POA: Diagnosis not present

## 2023-10-27 DIAGNOSIS — E1169 Type 2 diabetes mellitus with other specified complication: Secondary | ICD-10-CM

## 2023-10-27 DIAGNOSIS — R739 Hyperglycemia, unspecified: Secondary | ICD-10-CM

## 2023-10-27 DIAGNOSIS — Z79899 Other long term (current) drug therapy: Secondary | ICD-10-CM | POA: Insufficient documentation

## 2023-10-27 DIAGNOSIS — E1165 Type 2 diabetes mellitus with hyperglycemia: Secondary | ICD-10-CM | POA: Diagnosis not present

## 2023-10-27 DIAGNOSIS — Z1152 Encounter for screening for COVID-19: Secondary | ICD-10-CM | POA: Diagnosis not present

## 2023-10-27 DIAGNOSIS — R42 Dizziness and giddiness: Secondary | ICD-10-CM | POA: Diagnosis not present

## 2023-10-27 DIAGNOSIS — E860 Dehydration: Secondary | ICD-10-CM | POA: Diagnosis not present

## 2023-10-27 LAB — COMPREHENSIVE METABOLIC PANEL
ALT: 33 U/L (ref 0–44)
AST: 19 U/L (ref 15–41)
Albumin: 4.7 g/dL (ref 3.5–5.0)
Alkaline Phosphatase: 52 U/L (ref 38–126)
Anion gap: 9 (ref 5–15)
BUN: 21 mg/dL — ABNORMAL HIGH (ref 6–20)
CO2: 21 mmol/L — ABNORMAL LOW (ref 22–32)
Calcium: 9.3 mg/dL (ref 8.9–10.3)
Chloride: 96 mmol/L — ABNORMAL LOW (ref 98–111)
Creatinine, Ser: 1.07 mg/dL (ref 0.61–1.24)
GFR, Estimated: >60 mL/min (ref >60)
Glucose, Bld: 466 mg/dL — ABNORMAL HIGH (ref 70–99)
Potassium: 4 mmol/L (ref 3.5–5.1)
Sodium: 126 mmol/L — ABNORMAL LOW (ref 135–145)
Total Bilirubin: 0.9 mg/dL (ref <1.2)
Total Protein: 8.2 g/dL — ABNORMAL HIGH (ref 6.5–8.1)

## 2023-10-27 LAB — CBC WITH DIFFERENTIAL/PLATELET
Abs Immature Granulocytes: 0.01 10*3/uL (ref 0.00–0.07)
Basophils Absolute: 0 10*3/uL (ref 0.0–0.1)
Basophils Relative: 1 %
Eosinophils Absolute: 0.1 10*3/uL (ref 0.0–0.5)
Eosinophils Relative: 1 %
HCT: 44 % (ref 39.0–52.0)
Hemoglobin: 15.3 g/dL (ref 13.0–17.0)
Immature Granulocytes: 0 %
Lymphocytes Relative: 22 %
Lymphs Abs: 1.6 10*3/uL (ref 0.7–4.0)
MCH: 29.1 pg (ref 26.0–34.0)
MCHC: 34.8 g/dL (ref 30.0–36.0)
MCV: 83.8 fL (ref 80.0–100.0)
Monocytes Absolute: 0.6 10*3/uL (ref 0.1–1.0)
Monocytes Relative: 8 %
Neutro Abs: 5 10*3/uL (ref 1.7–7.7)
Neutrophils Relative %: 68 %
Platelets: 361 10*3/uL (ref 150–400)
RBC: 5.25 MIL/uL (ref 4.22–5.81)
RDW: 12.6 % (ref 11.5–15.5)
WBC: 7.4 10*3/uL (ref 4.0–10.5)
nRBC: 0 % (ref 0.0–0.2)

## 2023-10-27 LAB — HEMOGLOBIN A1C
Hgb A1c MFr Bld: 12.2 % — ABNORMAL HIGH (ref 4.8–5.6)
Mean Plasma Glucose: 303.44 mg/dL

## 2023-10-27 LAB — BETA-HYDROXYBUTYRIC ACID
Beta-Hydroxybutyric Acid: 2.05 mmol/L — ABNORMAL HIGH (ref 0.05–0.27)
Beta-Hydroxybutyric Acid: 1.06 mmol/L — ABNORMAL HIGH (ref 0.05–0.27)

## 2023-10-27 LAB — RESP PANEL BY RT-PCR (RSV, FLU A&B, COVID)  RVPGX2
Influenza A by PCR: NEGATIVE
Influenza B by PCR: NEGATIVE
Resp Syncytial Virus by PCR: NEGATIVE
SARS Coronavirus 2 by RT PCR: NEGATIVE

## 2023-10-27 LAB — URINALYSIS, ROUTINE W REFLEX MICROSCOPIC
Bacteria, UA: NONE SEEN
Bilirubin Urine: NEGATIVE
Glucose, UA: >=500 mg/dL — AB
Hgb urine dipstick: NEGATIVE
Ketones, ur: 20 mg/dL — AB
Leukocytes,Ua: NEGATIVE
Nitrite: NEGATIVE
Protein, ur: NEGATIVE mg/dL
Specific Gravity, Urine: 1.024 (ref 1.005–1.030)
pH: 5 (ref 5.0–8.0)

## 2023-10-27 LAB — BLOOD GAS, VENOUS
Acid-base deficit: 3.4 mmol/L — ABNORMAL HIGH (ref 0.0–2.0)
Bicarbonate: 21.4 mmol/L (ref 20.0–28.0)
O2 Saturation: 86 %
Patient temperature: 37
pCO2, Ven: 37 mm[Hg] — ABNORMAL LOW (ref 44–60)
pH, Ven: 7.37 (ref 7.25–7.43)
pO2, Ven: 52 mm[Hg] — ABNORMAL HIGH (ref 32–45)

## 2023-10-27 LAB — CBG MONITORING, ED
Glucose-Capillary: 503 mg/dL (ref 70–99)
Glucose-Capillary: 334 mg/dL — ABNORMAL HIGH (ref 70–99)
Glucose-Capillary: 369 mg/dL — ABNORMAL HIGH (ref 70–99)
Glucose-Capillary: 418 mg/dL — ABNORMAL HIGH (ref 70–99)
Glucose-Capillary: 373 mg/dL — ABNORMAL HIGH (ref 70–99)

## 2023-10-27 MED ORDER — INSULIN ASPART 100 UNIT/ML IJ SOLN
10.0000 [IU] | Freq: Once | INTRAMUSCULAR | Status: AC
  Administered 2023-10-27: 10 [IU] via INTRAVENOUS
  Filled 2023-10-27: qty 0.1

## 2023-10-27 MED ORDER — SODIUM CHLORIDE 0.9 % IV BOLUS
1000.0000 mL | Freq: Once | INTRAVENOUS | Status: AC
  Administered 2023-10-27: 1000 mL via INTRAVENOUS

## 2023-10-27 MED ORDER — INSULIN ASPART 100 UNIT/ML IJ SOLN
10.0000 [IU] | Freq: Once | INTRAMUSCULAR | Status: AC
  Administered 2023-10-27: 10 [IU] via SUBCUTANEOUS
  Filled 2023-10-27: qty 0.1

## 2023-10-27 MED ORDER — ROSUVASTATIN CALCIUM 20 MG PO TABS: 20.0000 mg | ORAL_TABLET | Freq: Every day | ORAL | 0 refills | Status: DC

## 2023-10-27 MED ORDER — KETOROLAC TROMETHAMINE 30 MG/ML IJ SOLN
30.0000 mg | Freq: Once | INTRAMUSCULAR | Status: AC
  Administered 2023-10-27: 30 mg via INTRAVENOUS
  Filled 2023-10-27: qty 1

## 2023-10-27 MED ORDER — INSULIN ASPART 100 UNIT/ML IJ SOLN
6.0000 [IU] | Freq: Once | INTRAMUSCULAR | Status: AC
  Administered 2023-10-27: 6 [IU] via SUBCUTANEOUS
  Filled 2023-10-27: qty 0.06

## 2023-10-27 NOTE — Discharge Instructions (Addendum)
You have been seen and discharged from the emergency department.  Your blood sugar and hemoglobin A1c were elevated.  It is time to follow-up with your primary doctor for reevaluation and possible transition to injectable insulin if this cannot be diet controlled.  Follow-up with your primary provider for further evaluation and further care. Take home medications as prescribed. If you have any worsening symptoms or further concerns for your health please return to an emergency department for further evaluation.

## 2023-10-27 NOTE — ED Triage Notes (Signed)
Pt BIB EMS from home, c/o fatigue, dizziness and hyperglycemia since yesterday. Poor PO intake for few days. CBG 559  BP 132/98 P 104 SpO2 95% CBG 559

## 2023-10-27 NOTE — ED Provider Notes (Signed)
[place] EMERGENCY DEPARTMENT AT [facility] Provider Note   CSN: [id number] Arrival date & time: 10/27/23  1406     History  Chief Complaint  Patient presents with   Hyperglycemia    Steve Nelson is a 42 y.o. male.  Pt is a 61 you male with pmhx significant for DM2, HTN, and HLD.  Pt said he's not been feeling well since September.  He was in [facility] working and felt his HR elevated.  He went to the ED in [facility] and was d/c and wore a monitor.  He wore it, sent it back, and has been unable to get any results.  Pt did f/u with pcp in October and it looks like she has also tried to get results, but has been unable to.  Pt said he's been taking his meds as directed and feels weak.  His bs has remained elevated.  He has been urinating a lot.  Pt said he had banana pudding and several regular sodas today, so it seems as he may need a nutrition consult regarding correct foods to eat while diabetic.  He does have an appt this month with cards in [place].       Home Medications Prior to Admission medications   Medication Sig Start Date End Date Taking? Authorizing Provider  amLODipine (NORVASC) 10 MG tablet TAKE 1 TABLET(10 MG) BY MOUTH DAILY 03/18/22   Grayce Sessions, NP  metFORMIN (GLUCOPHAGE-XR) 500 MG 24 hr tablet Take 1 tablet (500 mg total) by mouth 2 (two) times daily with a meal. 09/02/23   Grayce Sessions, NP  pantoprazole (PROTONIX) 40 MG tablet Take 1 tablet (40 mg total) by mouth daily. 09/25/23   Barrett, Horald Chestnut, PA-C  rosuvastatin (CRESTOR) 20 MG tablet Take 1 tablet (20 mg total) by mouth daily. 09/25/23   Barrett, Horald Chestnut, PA-C  valsartan-hydrochlorothiazide (DIOVAN-HCT) 160-25 MG tablet Take 1 tablet by mouth daily. 09/25/23   Grayce Sessions, NP      Allergies    Patient has no known allergies.    Review of Systems   Review of Systems  Cardiovascular:  Positive for palpitations.  Endocrine: Positive for polydipsia and polyuria.  Neurological:   Positive for weakness.  All other systems reviewed and are negative.   Physical Exam Updated Vital Signs BP (!) 131/94 (BP Location: Left Arm)   Pulse (!) 110   Temp 98.7 F (37.1 C) (Oral)   Resp 19   SpO2 95%  Physical Exam Vitals and nursing note reviewed.  Constitutional:      Appearance: Normal appearance. He is obese.  HENT:     Head: Normocephalic and atraumatic.     Right Ear: External ear normal.     Left Ear: External ear normal.     Nose: Nose normal.     Mouth/Throat:     Mouth: Mucous membranes are dry.  Eyes:     Extraocular Movements: Extraocular movements intact.     Conjunctiva/sclera: Conjunctivae normal.     Pupils: Pupils are equal, round, and reactive to light.  Cardiovascular:     Rate and Rhythm: Regular rhythm. Tachycardia present.     Pulses: Normal pulses.     Heart sounds: Normal heart sounds.  Pulmonary:     Effort: Pulmonary effort is normal.     Breath sounds: Normal breath sounds.  Abdominal:     General: Abdomen is flat. Bowel sounds are normal.     Palpations: Abdomen is soft.  Musculoskeletal:        General: Normal range of motion.     Cervical back: Normal range of motion and neck supple.  Skin:    General: Skin is warm.     Capillary Refill: Capillary refill takes less than 2 seconds.  Neurological:     General: No focal deficit present.     Mental Status: He is alert and oriented to person, place, and time.  Psychiatric:        Mood and Affect: Mood normal.        Behavior: Behavior normal.     ED Results / Procedures / Treatments   Labs (all labs ordered are listed, but only abnormal results are displayed) Labs Reviewed  COMPREHENSIVE METABOLIC PANEL - Abnormal; Notable for the following components:      Result Value   Sodium 126 (*)    Chloride 96 (*)    CO2 21 (*)    Glucose, Bld 466 (*)    BUN 21 (*)    Total Protein 8.2 (*)    All other components within normal limits  URINALYSIS, ROUTINE W REFLEX MICROSCOPIC  - Abnormal; Notable for the following components:   Color, Urine COLORLESS (*)    Glucose, UA >=500 (*)    Ketones, ur 20 (*)    All other components within normal limits  HEMOGLOBIN A1C - Abnormal; Notable for the following components:   Hgb A1c MFr Bld 12.2 (*)    All other components within normal limits  BLOOD GAS, VENOUS - Abnormal; Notable for the following components:   pCO2, Ven 37 (*)    pO2, Ven 52 (*)    Acid-base deficit 3.4 (*)    All other components within normal limits  BETA-HYDROXYBUTYRIC ACID - Abnormal; Notable for the following components:   Beta-Hydroxybutyric Acid 2.05 (*)    All other components within normal limits  CBG MONITORING, ED - Abnormal; Notable for the following components:   Glucose-Capillary 503 (*)    All other components within normal limits  CBG MONITORING, ED - Abnormal; Notable for the following components:   Glucose-Capillary 334 (*)    All other components within normal limits  RESP PANEL BY RT-PCR (RSV, FLU A&B, COVID)  RVPGX2  CBC WITH DIFFERENTIAL/PLATELET  BETA-HYDROXYBUTYRIC ACID    EKG EKG Interpretation Date/Time:  Monday October 27 2023 14:13:10 EST Ventricular Rate:  110 PR Interval:  151 QRS Duration:  82 QT Interval:  309 QTC Calculation: 418 R Axis:   4  Text Interpretation: Sinus tachycardia Atrial premature complex Anteroseptal infarct, old Borderline ST elevation, lateral leads No significant change since last tracing Confirmed by Jacalyn Lefevre [id number]) on 10/27/2023 2:58:21 PM  Radiology No results found.  Procedures Procedures    Medications Ordered in ED Medications  sodium chloride 0.9 % bolus 1,000 mL (0 mLs Intravenous Stopped 10/27/23 1630)  insulin aspart (novoLOG) injection 10 Units (10 Units Intravenous Given 10/27/23 1459)  sodium chloride 0.9 % bolus 1,000 mL (1,000 mLs Intravenous New Bag/Given 10/27/23 1649)  ketorolac (TORADOL) 30 MG/ML injection 30 mg (30 mg Intravenous Given 10/27/23 1650)     ED Course/ Medical Decision Making/ A&P                                 Medical Decision Making Amount and/or Complexity of Data Reviewed Labs: ordered. Radiology: ordered.  Risk Prescription drug management.   This patient presents to the  ED for concern of weakness, this involves an extensive number of treatment options, and is a complaint that carries with it a high risk of complications and morbidity.  The differential diagnosis includes hyperglycemia, electrolyte abn   Co morbidities that complicate the patient evaluation  DM2, HTN, and HLD   Additional history obtained:  Additional history obtained from epic chart review External records from outside source obtained and reviewed including EMS report   Lab Tests:  I Ordered, and personally interpreted labs.  The pertinent results include:  ua neg for uti, + ketones; VBH with pH 7.37, cbc elevated at 503; cmp with na low at 126 (corrected 132), glucose elevated at 466; covid/flu/rsv neg; hgb A1c 12.2   Imaging Studies ordered:  I ordered imaging studies including cxr  Pending since shift change   Cardiac Monitoring:  The patient was maintained on a cardiac monitor.  I personally viewed and interpreted the cardiac monitored which showed an underlying rhythm of: st   Medicines ordered and prescription drug management:  I ordered medication including ivfs/iv insulin  for hyperglycemia  Reevaluation of the patient after these medicines showed that the patient improved I have reviewed the patients home medicines and have made adjustments as needed  Critical Interventions:  Ivfs/iv insulin   Problem List / ED Course:  Hyperglycemia:  likely related to poor diet.  Pt's bs has decreased with IVFs and insulin.  Pt's VBG is nl, but BHB is +.  I suspect it that will go away with hydration and insulin.  Will repeat to make sure. Palpitations:  HR has improved with hydration.   Reevaluation:  After the  interventions noted above, I reevaluated the patient and found that they have :improved   Social Determinants of Health:  Lives at home   Dispostion:  After consideration of the diagnostic results and the patients response to treatment, I feel that the patent would benefit from discharge with outpatient f/u.          Final Clinical Impression(s) / ED Diagnoses Final diagnoses:  Hyperglycemia  Dehydration    Rx / DC Orders ED Discharge Orders     None         Jacalyn Lefevre, MD 10/27/23 1726

## 2023-10-27 NOTE — ED Provider Notes (Signed)
Patient signed out to me by previous provider. Please refer to their note for full HPI.  Briefly this is a 42 year old diabetic male on oral metformin who presented to the emergency department with general unwell feeling for the past couple months.  Complaining of generalized weakness and increased urination.  Patient has a poor diet in regards to his diabetes.  Workup here shows hyperglycemia with a hemoglobin A1c over 12.  No findings of DKA/EDKA/HHS.  Patient was pending hydration and medication with repeat beta hydroxybutyrate and CBG.  Beta hydroxy butyrate has downtrended to 1.06 appropriately.  Repeat CBG had elevated to the 400s however I was informed that the patient ate 2 sandwiches and had a soda.  Patient was redosed insulin and repeat CBG shows downtrending into the 300s.  Patient educated about his disease and the importance of diet control.  He will follow-up with his PCP in regards to his hemoglobin A1c for further evaluation and treatment.  Patient at this time appears safe and stable for discharge and close outpatient follow up. Discharge plan and strict return to ED precautions discussed, patient verbalizes understanding and agreement.   Rozelle Logan, DO 10/27/23 2202

## 2023-11-03 ENCOUNTER — Telehealth: Payer: Self-pay | Admitting: Primary Care

## 2023-11-03 NOTE — Telephone Encounter (Signed)
Copied from CRM [id number]. Topic: General - Inquiry >> Nov 03, 2023  2:02 PM Runell Gess P wrote: Reason for CRM: pt called saying he needs a note for work since the last time he was there saying he needs to see cardiology before he returns to work.  CB@  [phone]

## 2023-11-04 NOTE — Progress Notes (Signed)
Cardiology Office Note:   Date:  11/07/2023  ID:  Steve Nelson, DOB 1981/05/01, MRN [id number] PCP:  Grayce Sessions, NP  [facility] Providers Cardiologist:  Alverda Skeans, MD Referring MD: Grayce Sessions, NP  Chief Complaint/Reason for Referral: ER follow-up chest pain ASSESSMENT:    1. Precordial pain   2. Type 2 diabetes mellitus without complication, without long-term current use of insulin (HCC)   3. Hypertension associated with diabetes (HCC)   4. Hyperlipidemia associated with type 2 diabetes mellitus (HCC)   5. BMI 37.0-37.9, adult   6. Dizziness     PLAN:   In order of problems listed above: Chest pain:  We will obtain a coronary CTA and echocardiogram to evaluate further.  If the patient has mild obstructive coronary artery disease, they will require a statin (with goal LDL < 70) and aspirin, if they have high-grade disease we will need to consider optimal medical therapy and if symptoms are refractory to medical therapy, then a cardiac catheterization with possible PCI will be pursued to alleviate symptoms.  If they have high risk disease we will proceed directly to cardiac catheterization.   Type 2 diabetes mellitus: Continue Diovan, Crestor 20, start aspirin 81 mg, starting insulin per PCP.  Defer SGT2i for now; will consider at next visit. Hypertension: Continue Diovan/HCT and amlodipine; BP is well-controlled. Hyperlipidemia: Check lipid panel, LFTs, and LP(a) in 2 months. Elevated BMI: Continue Ozempic. Dizziness:  TTE and monitor.  Resting tachycardia likely due to poorly controlled blood sugars.  Will check reflex TSH, CBC, and CMP.            Dispo:  Return in about 6 months (around 05/07/2024).      Medication Adjustments/Labs and Tests Ordered: Current medicines are reviewed at length with the patient today.  Concerns regarding medicines are outlined above.  The following changes have been made:     Labs/tests ordered: Orders Placed This  Encounter  Procedures   CT CORONARY MORPH W/CTA COR W/SCORE W/CA W/CM &/OR WO/CM   Lipid Profile   Hepatic function panel   Lipoprotein A (LPA)   Comp Met (CMET)   CBC   TSH Rfx on Abnormal to Free T4   LONG TERM MONITOR (3-14 DAYS)   EKG 12-Lead   ECHOCARDIOGRAM COMPLETE    Medication Changes: Meds ordered this encounter  Medications   ivabradine (CORLANOR) 5 MG TABS tablet    Sig: Take 1 tablet (5 mg total) by mouth once for 1 dose. Take 90-120 minutes prior to scan.    Dispense:  1 tablet    Refill:  0    Use cash pricing for corlanor procedure   metoprolol tartrate (LOPRESSOR) 100 MG tablet    Sig: Take 1 tablet (100 mg total) by mouth once for 1 dose. Take 90-120 minutes prior to scan. Hold for SBP less than 110.    Dispense:  1 tablet    Refill:  0   aspirin EC 81 MG tablet    Sig: Take 1 tablet (81 mg total) by mouth daily. Swallow whole.    Dispense:  30 tablet    Refill:  5    Current medicines are reviewed at length with the patient today.  The patient does not have concerns regarding medicines.     History of Present Illness:      FOCUSED PROBLEM LIST:   Type 2 diabetes mellitus On metformin HbA1C 06 Nov 2023 >> starting insulin Hypertension Hyperlipidemia BMI 35 On Ozempic  GERD Palpitations Rare PACs and PVCs monitor 2024 (Care Everywhere)  12/24: The patient is a 42 year old male with the above listed medical problems here for emergency room follow-up for chest pain.  The patient presented in October to [facility] with chest pain and dizziness.  The chest pain was reportedly random and sometimes occurred when laying down or after eating.  He was noted to be out of his hypertensive medication and his blood pressure was 137/98.  Head CT and chest x-ray were reassuring.  Troponins and D-dimer were also reassuring.  His blood work was significant for an elevated blood glucose level.  He was ultimately discharged home.  He then presented in  December with failure to thrive.  His blood glucose was found to be quite elevated and his hemoglobin A1c was 12.  He was ultimately discharged home.  He saw his PCP recently and was started on Ozempic and he will be starting insulin soon.  He tells me that he does develop chest pain at times.  It can happen at rest and sometimes with exertion.  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  He occasionally gets dizzy and has palpitations along with this.  He is unsure whether this is related to his blood sugars or not.  He fortunately has not passed out.  He has not had any issues with his rosuvastatin, Diovan/HCT, or amlodipine.  He denies any peripheral edema or paroxysmal nocturnal dyspnea.  He fortunately has quit smoking.          Current Medications: Current Meds  Medication Sig   amLODipine (NORVASC) 10 MG tablet TAKE 1 TABLET(10 MG) BY MOUTH DAILY   aspirin EC 81 MG tablet Take 1 tablet (81 mg total) by mouth daily. Swallow whole.   ivabradine (CORLANOR) 5 MG TABS tablet Take 1 tablet (5 mg total) by mouth once for 1 dose. Take 90-120 minutes prior to scan.   metFORMIN (GLUCOPHAGE-XR) 500 MG 24 hr tablet Take 1 tablet (500 mg total) by mouth 2 (two) times daily with a meal.   metoprolol tartrate (LOPRESSOR) 100 MG tablet Take 1 tablet (100 mg total) by mouth once for 1 dose. Take 90-120 minutes prior to scan. Hold for SBP less than 110.   pantoprazole (PROTONIX) 40 MG tablet Take 1 tablet (40 mg total) by mouth daily.   rosuvastatin (CRESTOR) 20 MG tablet Take 1 tablet (20 mg total) by mouth daily.   Semaglutide,0.25 or 0.5MG /DOS, (OZEMPIC, 0.25 OR 0.5 MG/DOSE,) 2 MG/3ML SOPN Inject 0.25 mg into the skin once a week.   valsartan-hydrochlorothiazide (DIOVAN-HCT) 160-25 MG tablet Take 1 tablet by mouth daily.   XIIDRA 5 % SOLN Apply 1 drop to eye 2 (two) times daily.     Review of Systems:   Please see the history of present illness.    All other systems  reviewed and are negative.     EKGs/Labs/Other Test Reviewed:   EKG: EKG from December 2024 demonstrates sinus tachycardia with borderline ST changes laterally  EKG Interpretation Date/Time:  Friday November 07 2023 10:09:03 EST Ventricular Rate:  103 PR Interval:  130 QRS Duration:  84 QT Interval:  314 QTC Calculation: 411 R Axis:   26  Text Interpretation: Sinus tachycardia Minimal voltage criteria for LVH, may be normal variant ( R in aVL ) Inferior infarct , age undetermined When compared with ECG of 27-Oct-2023 14:13, PREVIOUS ECG IS PRESENT Confirmed by Alverda Skeans ([id number]) on  11/07/2023 10:14:15 AM         Risk Assessment/Calculations:          Physical Exam:   VS:  BP 104/76   Pulse (!) 110   Ht 5' 6.5" (1.689 m)   Wt 216 lb 9.6 oz (98.2 kg)   SpO2 93%   BMI 34.44 kg/m        Wt Readings from Last 3 Encounters:  11/07/23 216 lb 9.6 oz (98.2 kg)  11/06/23 220 lb (99.8 kg)  09/25/23 235 lb 12.8 oz (107 kg)      GENERAL:  No apparent distress, AOx3 HEENT:  No carotid bruits, +2 carotid impulses, no scleral icterus CAR: Tachycardic no murmurs, gallops, rubs, or thrills RES:  Clear to auscultation bilaterally ABD:  Soft, nontender, nondistended, positive bowel sounds x 4 VASC:  +2 radial pulses, +2 carotid pulses NEURO:  CN 2-12 grossly intact; motor and sensory grossly intact PSYCH:  No active depression or anxiety EXT:  No edema, ecchymosis, or cyanosis  Signed, Orbie Pyo, MD  11/07/2023 11:12 AM    [facility] [address] [place], [place], [place]  [address] Phone: [phone]; Fax: [phone]   Note:  This document was prepared using Dragon voice recognition software and may include unintentional dictation errors.

## 2023-11-04 NOTE — Telephone Encounter (Signed)
Will forward to provider  

## 2023-11-06 ENCOUNTER — Ambulatory Visit (INDEPENDENT_AMBULATORY_CARE_PROVIDER_SITE_OTHER): Payer: Medicaid Other | Admitting: Primary Care

## 2023-11-06 ENCOUNTER — Telehealth: Payer: Self-pay | Admitting: Pharmacist

## 2023-11-06 ENCOUNTER — Other Ambulatory Visit: Payer: Self-pay

## 2023-11-06 ENCOUNTER — Encounter (INDEPENDENT_AMBULATORY_CARE_PROVIDER_SITE_OTHER): Payer: Self-pay | Admitting: Pharmacist

## 2023-11-06 VITALS — BP 120/81 | HR 113 | Resp 16 | Wt 220.0 lb

## 2023-11-06 DIAGNOSIS — E66812 Obesity, class 2: Secondary | ICD-10-CM | POA: Diagnosis not present

## 2023-11-06 DIAGNOSIS — E119 Type 2 diabetes mellitus without complications: Secondary | ICD-10-CM | POA: Diagnosis not present

## 2023-11-06 DIAGNOSIS — Z794 Long term (current) use of insulin: Secondary | ICD-10-CM

## 2023-11-06 DIAGNOSIS — Z09 Encounter for follow-up examination after completed treatment for conditions other than malignant neoplasm: Secondary | ICD-10-CM

## 2023-11-06 DIAGNOSIS — Z7984 Long term (current) use of oral hypoglycemic drugs: Secondary | ICD-10-CM

## 2023-11-06 DIAGNOSIS — Z7985 Long-term (current) use of injectable non-insulin antidiabetic drugs: Secondary | ICD-10-CM

## 2023-11-06 DIAGNOSIS — Z6835 Body mass index (BMI) 35.0-35.9, adult: Secondary | ICD-10-CM | POA: Diagnosis not present

## 2023-11-06 DIAGNOSIS — E669 Obesity, unspecified: Secondary | ICD-10-CM

## 2023-11-06 LAB — POCT URINALYSIS DIP (CLINITEK)
Bilirubin, UA: NEGATIVE
Blood, UA: NEGATIVE
Glucose, UA: 500 mg/dL — AB
Leukocytes, UA: NEGATIVE
Nitrite, UA: NEGATIVE
POC PROTEIN,UA: NEGATIVE
Spec Grav, UA: 1.01 (ref 1.010–1.025)
Urobilinogen, UA: 0.2 U/dL
pH, UA: 5.5 (ref 5.0–8.0)

## 2023-11-06 LAB — GLUCOSE, POCT (MANUAL RESULT ENTRY)
POC Glucose: 437 mg/dL — AB (ref 70–99)
POC Glucose: 447 mg/dL — AB (ref 70–99)

## 2023-11-06 MED ORDER — OZEMPIC (0.25 OR 0.5 MG/DOSE) 2 MG/3ML ~~LOC~~ SOPN
0.2500 mg | PEN_INJECTOR | SUBCUTANEOUS | 1 refills | Status: DC
Start: 2023-11-06 — End: 2023-12-11

## 2023-11-06 MED ORDER — INSULIN ASPART 100 UNIT/ML IJ SOLN
20.0000 [IU] | Freq: Once | INTRAMUSCULAR | Status: AC
Start: 2023-11-06 — End: 2023-11-06
  Administered 2023-11-06: 20 [IU] via SUBCUTANEOUS

## 2023-11-06 NOTE — Telephone Encounter (Signed)
[place] friend. Can we start a PA for this patient's Ozempic?

## 2023-11-06 NOTE — Progress Notes (Signed)
Subjective:   Steve Nelson is a 42 y.o. male presents for ED follow up. Presented to ED on 10/27/23,complaining of generalized weakness and increased urination  patient dx with  Hyperglycemia and Dehydration patient was admitted for:   Past Medical History:  Diagnosis Date   Chest pain    DM (diabetes mellitus) (HCC)    HLD (hyperlipidemia)    HTN (hypertension)    Hyperglycemia      No Known Allergies  Current Outpatient Medications on File Prior to Visit  Medication Sig Dispense Refill   amLODipine (NORVASC) 10 MG tablet TAKE 1 TABLET(10 MG) BY MOUTH DAILY 90 tablet 1   metFORMIN (GLUCOPHAGE-XR) 500 MG 24 hr tablet Take 1 tablet (500 mg total) by mouth 2 (two) times daily with a meal. 180 tablet 1   pantoprazole (PROTONIX) 40 MG tablet Take 1 tablet (40 mg total) by mouth daily. 30 tablet 0   rosuvastatin (CRESTOR) 20 MG tablet Take 1 tablet (20 mg total) by mouth daily. 30 tablet 0   valsartan-hydrochlorothiazide (DIOVAN-HCT) 160-25 MG tablet Take 1 tablet by mouth daily. 90 tablet 1   No current facility-administered medications on file prior to visit.    Review of System: Comprehensive ROS Pertinent positive and negative noted in HPI   Objective:  BP 120/81   Pulse (!) 113   Resp 16   Wt 220 lb (99.8 kg)   SpO2 96%   BMI 35.51 kg/m  Physical Exam Vitals reviewed.  Constitutional:      Appearance: He is obese.  HENT:     Right Ear: Tympanic membrane and external ear normal.     Left Ear: External ear normal.     Nose: Nose normal.  Eyes:     Extraocular Movements: Extraocular movements intact.     Pupils: Pupils are equal, round, and reactive to light.  Cardiovascular:     Pulses: Normal pulses.     Heart sounds: Normal heart sounds.  Pulmonary:     Effort: Pulmonary effort is normal.     Breath sounds: Normal breath sounds.  Abdominal:     General: Bowel sounds are normal. There is distension.     Palpations: Abdomen is soft.  Musculoskeletal:         General: Normal range of motion.     Cervical back: Normal range of motion and neck supple.  Skin:    General: Skin is warm and dry.  Neurological:     Mental Status: He is oriented to person, place, and time.  Psychiatric:        Mood and Affect: Mood normal.        Behavior: Behavior normal.      Assessment:  Steve Nelson was seen today for hospitalization follow-up.  Diagnoses and all orders for this visit:  Type 2 diabetes mellitus without complication, without long-term current use of insulin (HCC) Followed by  clinical pharmacist   Complications from uncontrolled diabetes -diabetic retinopathy leading to blindness, diabetic nephropathy leading to dialysis, decrease in circulation decrease in sores or wound healing which may lead to amputations and increase of heart attack and stroke  -     POCT glucose (manual entry) -     Microalbumin / creatinine urine ratio -     POCT URINALYSIS DIP (CLINITEK) -     insulin aspart (novoLOG) injection 20 Units -     POCT glucose (manual entry)  Hospital discharge follow-up  See HPI  Other orders -  Semaglutide,0.25 or 0.5MG /DOS, (OZEMPIC, 0.25 OR 0.5 MG/DOSE,) 2 MG/3ML SOPN; Inject 0.25 mg into the skin once a week.     This note has been created with Education officer, environmental. Any transcriptional errors are unintentional.   Return in about 3 months (around 02/04/2024).  Grayce Sessions, NP 11/16/2023, 3:32 PM

## 2023-11-06 NOTE — Patient Instructions (Signed)
 Semaglutide Injection What is this medication? SEMAGLUTIDE (SEM a GLOO tide) treats type 2 diabetes. It works by increasing insulin levels in your body, which decreases your blood sugar (glucose).   It also reduces the amount of sugar released into your blood and slows down your digestion. It may also be used to lower the risk of heart attack and stroke in people with type 2 diabetes. Changes to diet and exercise are often combined with this medication. This medicine may be used for other purposes; ask your health care provider or pharmacist if you have questions. COMMON BRAND NAME(S): OZEMPIC What should I tell my care team before I take this medication? They need to know if you have any of these conditions: Endocrine tumors (MEN 2) or if someone in your family had these tumors Eye disease, vision problems History of pancreatitis Kidney disease Stomach problems Thyroid cancer or if someone in your family had thyroid cancer An unusual or allergic reaction to semaglutide, other medications, foods, dyes, or preservatives Pregnant or trying to get pregnant Breast-feeding How should I use this medication? This medication is for injection under the skin of your upper leg (thigh), stomach area, or upper arm. It is given once every week (every 7 days). You will be taught how to prepare and give this medication. Use exactly as directed. Take your medication at regular intervals. Do not take it more often than directed. If you use this medication with insulin, you should inject this medication and the insulin separately. Do not mix them together. Do not give the injections right next to each other. Change (rotate) injection sites with each injection. It is important that you put your used needles and syringes in a special sharps container. Do not put them in a trash can. If you do not have a sharps container, call your pharmacist or care team to get one. A special MedGuide will be given to you by the  pharmacist with each prescription and refill. Be sure to read this information carefully each time. This medication comes with INSTRUCTIONS FOR USE. Ask your pharmacist for directions on how to use this medication. Read the information carefully. Talk to your pharmacist or care team if you have questions. Talk to your care team about the use of this medication in children. Special care may be needed. Overdosage: If you think you have taken too much of this medicine contact a poison control center or emergency room at once. NOTE: This medicine is only for you. Do not share this medicine with others. What if I miss a dose? If you miss a dose, take it as soon as you can within 5 days after the missed dose. Then take your next dose at your regular weekly time. If it has been longer than 5 days after the missed dose, do not take the missed dose. Take the next dose at your regular time. Do not take double or extra doses. If you have questions about a missed dose, contact your care team for advice. What may interact with this medication? Other medications for diabetes Many medications may cause changes in blood sugar, these include: Alcohol containing beverages Antiviral medications for HIV or AIDS Aspirin and aspirin-like medications Certain medications for blood pressure, heart disease, irregular heart beat Chromium Diuretics Male hormones, such as estrogens or progestins, birth control pills Fenofibrate Gemfibrozil Isoniazid Lanreotide Male hormones or anabolic steroids MAOIs like Carbex, Eldepryl, Marplan, Nardil, and Parnate Medications for weight loss Medications for allergies, asthma, cold, or cough Medications  for depression, anxiety, or psychotic disturbances Niacin Nicotine NSAIDs, medications for pain and inflammation, like ibuprofen or naproxen Octreotide Pasireotide Pentamidine Phenytoin Probenecid Quinolone antibiotics such as ciprofloxacin, levofloxacin, ofloxacin Some  herbal dietary supplements Steroid medications such as prednisone or cortisone Sulfamethoxazole; trimethoprim Thyroid hormones Some medications can hide the warning symptoms of low blood sugar (hypoglycemia). You may need to monitor your blood sugar more closely if you are taking one of these medications. These include: Beta-blockers, often used for high blood pressure or heart problems (examples include atenolol, metoprolol, propranolol) Clonidine Guanethidine Reserpine This list may not describe all possible interactions. Give your health care provider a list of all the medicines, herbs, non-prescription drugs, or dietary supplements you use. Also tell them if you smoke, drink alcohol, or use illegal drugs. Some items may interact with your medicine. What should I watch for while using this medication? Visit your care team for regular checks on your progress. Drink plenty of fluids while taking this medication. Check with your care team if you get an attack of severe diarrhea, nausea, and vomiting. The loss of too much body fluid can make it dangerous for you to take this medication. A test called the HbA1C (A1C) will be monitored. This is a simple blood test. It measures your blood sugar control over the last 2 to 3 months. You will receive this test every 3 to 6 months. Learn how to check your blood sugar. Learn the symptoms of low and high blood sugar and how to manage them. Always carry a quick-source of sugar with you in case you have symptoms of low blood sugar. Examples include hard sugar candy or glucose tablets. Make sure others know that you can choke if you eat or drink when you develop serious symptoms of low blood sugar, such as seizures or unconsciousness. They must get medical help at once. Tell your care team if you have high blood sugar. You might need to change the dose of your medication. If you are sick or exercising more than usual, you might need to change the dose of your  medication. Do not skip meals. Ask your care team if you should avoid alcohol. Many nonprescription cough and cold products contain sugar or alcohol. These can affect blood sugar. Pens should never be shared. Even if the needle is changed, sharing may result in passing of viruses like hepatitis or HIV. Wear a medical ID bracelet or chain, and carry a card that describes your disease and details of your medication and dosage times. What side effects may I notice from receiving this medication? Side effects that you should report to your care team as soon as possible: Allergic reactions--skin rash, itching, hives, swelling of the face, lips, tongue, or throat Change in vision Dehydration--increased thirst, dry mouth, feeling faint or lightheaded, headache, dark yellow or brown urine Gallbladder problems--severe stomach pain, nausea, vomiting, fever Heart palpitations--rapid, pounding, or irregular heartbeat Kidney injury--decrease in the amount of urine, swelling of the ankles, hands, or feet Pancreatitis--severe stomach pain that spreads to your back or gets worse after eating or when touched, fever, nausea, vomiting Thoughts of suicide or self-harm, worsening mood, feelings of depression Thyroid cancer--new mass or lump in the neck, pain or trouble swallowing, trouble breathing, hoarseness Side effects that usually do not require medical attention (report these to your care team if they continue or are bothersome): Diarrhea Loss of appetite Nausea Upset stomach This list may not describe all possible side effects. Call your doctor for medical  advice about side effects. You may report side effects to FDA at 1-800-FDA-1088. Where should I keep my medication? Keep out of the reach of children. Store unopened pens in a refrigerator between 2 and 8 degrees C (36 and 46 degrees F). Do not freeze. Protect from light and heat. After you first use the pen, it can be stored for 56 days at room  temperature between 15 and 30 degrees C (59 and 86 degrees F) or in a refrigerator. Throw away your used pen after 56 days or after the expiration date, whichever comes first. Do not store your pen with the needle attached. If the needle is left on, medication may leak from the pen. NOTE: This sheet is a summary. It may not cover all possible information. If you have questions about this medicine, talk to your doctor, pharmacist, or health care provider.  2024 Elsevier/Gold Standard (2023-02-17 00:00:00)

## 2023-11-07 ENCOUNTER — Encounter: Payer: Self-pay | Admitting: Internal Medicine

## 2023-11-07 ENCOUNTER — Ambulatory Visit: Payer: Medicaid Other | Attending: Internal Medicine | Admitting: Internal Medicine

## 2023-11-07 ENCOUNTER — Ambulatory Visit: Payer: Medicaid Other

## 2023-11-07 ENCOUNTER — Other Ambulatory Visit: Payer: Self-pay

## 2023-11-07 ENCOUNTER — Other Ambulatory Visit (HOSPITAL_COMMUNITY): Payer: Self-pay

## 2023-11-07 VITALS — BP 104/76 | HR 110 | Ht 66.5 in | Wt 216.6 lb

## 2023-11-07 DIAGNOSIS — E1159 Type 2 diabetes mellitus with other circulatory complications: Secondary | ICD-10-CM | POA: Diagnosis not present

## 2023-11-07 DIAGNOSIS — E119 Type 2 diabetes mellitus without complications: Secondary | ICD-10-CM

## 2023-11-07 DIAGNOSIS — E785 Hyperlipidemia, unspecified: Secondary | ICD-10-CM | POA: Diagnosis not present

## 2023-11-07 DIAGNOSIS — E1169 Type 2 diabetes mellitus with other specified complication: Secondary | ICD-10-CM

## 2023-11-07 DIAGNOSIS — R42 Dizziness and giddiness: Secondary | ICD-10-CM

## 2023-11-07 DIAGNOSIS — I152 Hypertension secondary to endocrine disorders: Secondary | ICD-10-CM

## 2023-11-07 DIAGNOSIS — Z6837 Body mass index (BMI) 37.0-37.9, adult: Secondary | ICD-10-CM | POA: Diagnosis not present

## 2023-11-07 DIAGNOSIS — R072 Precordial pain: Secondary | ICD-10-CM | POA: Diagnosis not present

## 2023-11-07 LAB — MICROALBUMIN / CREATININE URINE RATIO
Creatinine, Urine: 39 mg/dL
Microalb/Creat Ratio: 68 mg/g{creat} — ABNORMAL HIGH (ref 0–29)
Microalbumin, Urine: 26.5 ug/mL

## 2023-11-07 MED ORDER — IVABRADINE HCL 5 MG PO TABS
5.0000 mg | ORAL_TABLET | Freq: Once | ORAL | 0 refills | Status: AC
  Filled 2023-11-07: qty 1, 1d supply, fill #0

## 2023-11-07 MED ORDER — ASPIRIN 81 MG PO TBEC
81.0000 mg | DELAYED_RELEASE_TABLET | Freq: Every day | ORAL | 5 refills | Status: DC
  Filled 2023-11-07: qty 30, 30d supply, fill #0
  Filled 2024-01-02 – 2024-03-05 (×3): qty 30, 30d supply, fill #1

## 2023-11-07 MED ORDER — METOPROLOL TARTRATE 100 MG PO TABS
100.0000 mg | ORAL_TABLET | Freq: Once | ORAL | 0 refills | Status: DC
  Filled 2023-11-07: qty 1, 1d supply, fill #0

## 2023-11-07 NOTE — Progress Notes (Unsigned)
Enrolled patient for a 3 day Zio XT monitor to be mailed to patients home  

## 2023-11-07 NOTE — Patient Instructions (Signed)
Medication Instructions:   START TAKING ASPIRIN 81 MG BY MOUTH DAILY  *If you need a refill on your cardiac medications before your next appointment, please call your pharmacy*   Lab Work:  1.)  TODAY--CMET, CBC, AND TSH WITH REFLEX--GO DOWNSTAIRS FIRST FLOOR TO [facility] TO GET DONE  2.)  IN 2 MONTHS AT [facility] ON FIRST FLOOR OF THIS BUILDING--WILL CHECK LIPIDS, LFTs, AND LIPOPROTEIN A---PLEASE COME FASTING TO THIS LAB APPOINTMENT  If you have labs (blood work) drawn today and your tests are completely normal, you will receive your results only by: MyChart Message (if you have MyChart) OR A paper copy in the mail If you have any lab test that is abnormal or we need to change your treatment, we will call you to review the results.   Testing/Procedures:  Your physician has requested that you have an echocardiogram. Echocardiography is a painless test that uses sound waves to create images of your heart. It provides your doctor with information about the size and shape of your heart and how well your heart's chambers and valves are working. This procedure takes approximately one hour. There are no restrictions for this procedure. Please do NOT wear cologne, perfume, aftershave, or lotions (deodorant is allowed). Please arrive 15 minutes prior to your appointment time.  Please note: We ask at that you not bring children with you during ultrasound (echo/ vascular) testing. Due to room size and safety concerns, children are not allowed in the ultrasound rooms during exams. Our front office staff cannot provide observation of children in our lobby area while testing is being conducted. An adult accompanying a patient to their appointment will only be allowed in the ultrasound room at the discretion of the ultrasound technician under special circumstances. We apologize for any inconvenience.     Your cardiac CT will be scheduled at one of the below locations:   [facility] [address] [place], [place] [address] [phone]    If scheduled at [facility], please arrive at the [facility] Entrance (Entrance C2) of [facility] 30 minutes prior to test start time. You can use the FREE valet parking offered at entrance C (encouraged to control the heart rate for the test)  Proceed to the [facility] Radiology Department (first floor) to check-in and test prep.  All radiology patients and guests should use entrance C2 at [facility], accessed from [facility], even though the hospital's physical address listed is [address].     Please follow these instructions carefully (unless otherwise directed):  An IV will be required for this test and Nitroglycerin will be given.  Hold all erectile dysfunction medications at least 3 days (72 hrs) prior to test. (Ie viagra, cialis, sildenafil, tadalafil, etc)   On the Night Before the Test: Be sure to Drink plenty of water. Do not consume any caffeinated/decaffeinated beverages or chocolate 12 hours prior to your test. Do not take any antihistamines 12 hours prior to your test.  On the Day of the Test: Drink plenty of water until 1 hour prior to the test. Do not eat any food 1 hour prior to test. You may take your regular medications prior to the test.  Take metoprolol 100 MG BY MOUTH (Lopressor) two hours prior to test AS WELL AS TAKE IVABRADINE 5 MG BY MOUTH 2 HOURS PRIOR TO TEST. If you take VALSARTAN-Hydrochlorothiazide please HOLD on the morning of the test. Patients who wear  a continuous glucose monitor MUST remove the device prior to scanning.        After the Test: Drink plenty of water. After receiving IV contrast, you may experience a mild flushed feeling. This is normal. On occasion, you may experience a mild rash up to 24 hours after the test. This is not dangerous. If this occurs, you can take Benadryl 25 mg and increase your fluid intake. If  you experience trouble breathing, this can be serious. If it is severe call 911 IMMEDIATELY. If it is mild, please call our office.  We will call to schedule your test 2-4 weeks out understanding that some insurance companies will need an authorization prior to the service being performed.   For more information and frequently asked questions, please visit our website : [email or web address]  For non-scheduling related questions, please contact the cardiac imaging nurse navigator should you have any questions/concerns: Cardiac Imaging Nurse Navigators Direct Office Dial: [phone]   For scheduling needs, including cancellations and rescheduling, please call [place], [phone].     ZIO XT- Long Term Monitor Instructions  Your physician has requested you wear a ZIO patch monitor for 3 days.  This is a single patch monitor. [employer] supplies one patch monitor per enrollment. Additional stickers are not available. Please do not apply patch if you will be having a Nuclear Stress Test,  Echocardiogram, Cardiac CT, MRI, or Chest Xray during the period you would be wearing the  monitor. The patch cannot be worn during these tests. You cannot remove and re-apply the  ZIO XT patch monitor.  Your ZIO patch monitor will be mailed 3 day USPS to your address on file. It may take 3-5 days  to receive your monitor after you have been enrolled.  Once you have received your monitor, please review the enclosed instructions. Your monitor  has already been registered assigning a specific monitor serial # to you.  Billing and Patient Assistance Program Information  We have supplied [employer] with any of your insurance information on file for billing purposes. [employer] offers a sliding scale Patient Assistance Program for patients that do not have  insurance, or whose insurance does not completely cover the cost of the ZIO monitor.  You must apply for the Patient Assistance Program to  qualify for this discounted rate.  To apply, please call [employer] at [phone], select option 4, select option 2, ask to apply for  Patient Assistance Program. Meredeth Ide will ask your household income, and how many people  are in your household. They will quote your out-of-pocket cost based on that information.  [employer] will also be able to set up a 81-month, interest-free payment plan if needed.  Applying the monitor   Shave hair from upper left chest.  Hold abrader disc by orange tab. Rub abrader in 40 strokes over the upper left chest as  indicated in your monitor instructions.  Clean area with 4 enclosed alcohol pads. Let dry.  Apply patch as indicated in monitor instructions. Patch will be placed under collarbone on left  side of chest with arrow pointing upward.  Rub patch adhesive wings for 2 minutes. Remove white label marked "1". Remove the white  label marked "2". Rub patch adhesive wings for 2 additional minutes.  While looking in a mirror, press and release button in center of patch. A small green light will  flash 3-4 times. This will be your only indicator that the monitor has been turned on.  Do not shower for the first  24 hours. You may shower after the first 24 hours.  Press the button if you feel a symptom. You will hear a small click. Record Date, Time and  Symptom in the Patient Logbook.  When you are ready to remove the patch, follow instructions on the last 2 pages of Patient  Logbook. Stick patch monitor onto the last page of Patient Logbook.  Place Patient Logbook in the blue and white box. Use locking tab on box and tape box closed  securely. The blue and white box has prepaid postage on it. Please place it in the mailbox as  soon as possible. Your physician should have your test results approximately 7 days after the  monitor has been mailed back to [facility].  Call [facility] Customer Care at [phone] if you have questions regarding  your ZIO XT  patch monitor. Call them immediately if you see an orange light blinking on your  monitor.  If your monitor falls off in less than 4 days, contact our Monitor department at [phone].  If your monitor becomes loose or falls off after 4 days call [employer] at [phone] for  suggestions on securing your monitor     Follow-Up:  6 MONTHS WITH DR. Lynnette Caffey

## 2023-11-10 ENCOUNTER — Ambulatory Visit: Payer: Medicaid Other | Attending: Family Medicine | Admitting: Pharmacist

## 2023-11-10 ENCOUNTER — Encounter (INDEPENDENT_AMBULATORY_CARE_PROVIDER_SITE_OTHER): Payer: Self-pay | Admitting: Primary Care

## 2023-11-10 DIAGNOSIS — E119 Type 2 diabetes mellitus without complications: Secondary | ICD-10-CM | POA: Diagnosis not present

## 2023-11-10 DIAGNOSIS — Z7984 Long term (current) use of oral hypoglycemic drugs: Secondary | ICD-10-CM | POA: Diagnosis not present

## 2023-11-10 DIAGNOSIS — Z7985 Long-term (current) use of injectable non-insulin antidiabetic drugs: Secondary | ICD-10-CM | POA: Diagnosis not present

## 2023-11-10 MED ORDER — ACCU-CHEK GUIDE TEST VI STRP: ORAL_STRIP | 6 refills | Status: AC

## 2023-11-10 MED ORDER — ACCU-CHEK GUIDE W/DEVICE KIT: PACK | 0 refills | Status: AC

## 2023-11-10 MED ORDER — ACCU-CHEK SOFTCLIX LANCETS MISC: 6 refills | Status: AC

## 2023-11-10 NOTE — Progress Notes (Signed)
    S:     No chief complaint on file.  42 y.o. male who presents for diabetes evaluation, education, and management. Patient arrives in good spirits and presents without any assistance.   Patient was referred and last seen by Primary Care Provider, Gwinda Passe, on 11/06/2023. She started Ozempic at that visit and referred him to me for teaching.    PMH is significant for T2DM, HLD, HTN.  At last visit, A1c was 12.2% and Steve Nelson started [employer]. Pt with no history of thyroid cancer or pancreatitis. His Cardiology team is investigating his chest pain, however, cardiac work-up to this point has been negative for any clinical ASCVD. He has no current hx of CKD, CHF.  Family/Social History:  -Fhx: HTN, DM -Tobacco: former smoker (quit in 06/2023) -Alcohol: none reported   Current diabetes medications include: metformin 500 mg XR BID, Ozempic 0.25 mg weekly  Current hypertension medications include: amlodipine 10 mg daily, valsartan-hydrochlorothiazide 160-25 mg daily  Current hyperlipidemia medications include: rosuvastatin 20 mg daily  Patient reports adherence to taking all medications as prescribed.   Insurance coverage: [place] Medicaid  Patient denies hypoglycemic events.  Patient denies nocturia (nighttime urination).  Patient denies neuropathy (nerve pain). Patient denies visual changes. Patient reports self foot exams.   Patient reported dietary habits: not covered at this vist  Patient-reported exercise habits: not covered at this visit   O:  No CGM or GM with him today.    Lab Results  Component Value Date   HGBA1C 12.2 (H) 10/27/2023   There were no vitals filed for this visit.  Lipid Panel     Component Value Date/Time   CHOL 238 (H) 03/18/2022 1129   TRIG 121 03/18/2022 1129   HDL 52 03/18/2022 1129   CHOLHDL 4.6 03/18/2022 1129   LDLCALC 164 (H) 03/18/2022 1129    Clinical Atherosclerotic Cardiovascular Disease (ASCVD): No  The 10-year ASCVD risk  score (Arnett DK, et al., 2019) is: 8.1%   Values used to calculate the score:     Age: 43 years     Sex: Male     Is Non-Hispanic African American: Yes     Diabetic: Yes     Tobacco smoker: No     Systolic Blood Pressure: 104 mmHg     Is BP treated: Yes     HDL Cholesterol: 52 mg/dL     Total Cholesterol: 238 mg/dL   Patient is participating in a Managed Medicaid Plan:  YES   A/P: Diabetes longstanding currently uncontrolled. Patient is able to verbalize appropriate hypoglycemia management plan. Medication adherence appears appropriate. Today's visit was for [facility] teaching and administration. We will address more DM management and dose titration at follow-up.  -Started Ozempic 0.25 mg weekly.  -Patient was educated on the use of the Ozempic pen. Reviewed necessary supplies and operation of the pen.  -Continue metformin.  -Accu Chek Guide supplies sent. -Patient educated on purpose, proper use, and potential adverse effects of Ozempic.  -Extensively discussed pathophysiology of diabetes, recommended lifestyle interventions, dietary effects on blood sugar control.  -Counseled on s/sx of and management of hypoglycemia.  -Next A1c anticipated 01/2024.   Written patient instructions provided. Patient verbalized understanding of treatment plan.  Total time in face to face counseling 30 minutes.    Follow-up:  Pharmacist in 1 month.   Butch Penny, PharmD, Patsy [place], CPP Clinical Pharmacist [facility] [phone]

## 2023-11-11 ENCOUNTER — Ambulatory Visit: Payer: Self-pay | Admitting: Cardiovascular Disease

## 2023-11-17 ENCOUNTER — Encounter (HOSPITAL_COMMUNITY): Payer: Self-pay

## 2023-11-18 ENCOUNTER — Other Ambulatory Visit (HOSPITAL_COMMUNITY): Payer: Self-pay

## 2023-11-18 ENCOUNTER — Telehealth (HOSPITAL_COMMUNITY): Payer: Self-pay | Admitting: *Deleted

## 2023-11-18 NOTE — Telephone Encounter (Signed)
Reaching out to patient to offer assistance regarding upcoming cardiac imaging study; pt verbalizes understanding of appt date/time, parking situation and where to check in, pre-test NPO status and medications ordered, and verified current allergies; name and call back number provided for further questions should they arise  Larey Brick RN Navigator Cardiac Imaging [facility] [phone] office [phone] cell  Patient to take 100mg  metoprolol tartrate and 5mg  ivabradine two hours prior to his cardiac CT scan. He is aware to arrive at 12 PM.

## 2023-11-20 ENCOUNTER — Ambulatory Visit (HOSPITAL_COMMUNITY)
Admission: RE | Admit: 2023-11-20 | Discharge: 2023-11-20 | Disposition: A | Discharge status: Home / Self Care | Payer: Medicaid Other | Source: Ambulatory Visit | Attending: Internal Medicine | Admitting: Internal Medicine

## 2023-11-20 ENCOUNTER — Encounter (HOSPITAL_COMMUNITY): Payer: Self-pay

## 2023-11-20 DIAGNOSIS — R072 Precordial pain: Secondary | ICD-10-CM | POA: Diagnosis not present

## 2023-11-20 DIAGNOSIS — E119 Type 2 diabetes mellitus without complications: Secondary | ICD-10-CM | POA: Diagnosis not present

## 2023-11-20 LAB — GLUCOSE, CAPILLARY: Glucose-Capillary: 380 mg/dL — ABNORMAL HIGH (ref 70–99)

## 2023-11-20 MED ORDER — NITROGLYCERIN 0.4 MG SL SUBL: 0.8000 mg | SUBLINGUAL_TABLET | Freq: Once | SUBLINGUAL | Status: DC

## 2023-11-20 MED ORDER — NITROGLYCERIN 0.4 MG SL SUBL
SUBLINGUAL_TABLET | SUBLINGUAL | Status: AC
  Filled 2023-11-20: qty 2

## 2023-11-20 MED ORDER — DILTIAZEM HCL 25 MG/5ML IV SOLN
INTRAVENOUS | Status: AC
  Filled 2023-11-20: qty 5

## 2023-11-20 MED ORDER — DILTIAZEM HCL 25 MG/5ML IV SOLN
10.0000 mg | INTRAVENOUS | Status: DC | PRN
  Administered 2023-11-20: 10 mg via INTRAVENOUS

## 2023-11-20 MED ORDER — METOPROLOL TARTRATE 5 MG/5ML IV SOLN
INTRAVENOUS | Status: AC
Start: 2023-11-20 — End: ?
  Filled 2023-11-20: qty 5

## 2023-11-20 MED ORDER — METOPROLOL TARTRATE 5 MG/5ML IV SOLN
10.0000 mg | Freq: Once | INTRAVENOUS | Status: AC | PRN
  Administered 2023-11-20: 5 mg via INTRAVENOUS

## 2023-11-20 NOTE — Progress Notes (Signed)
Patient's HR 77-81, BP 102/78 after 5 mg metoprolol and 10 mg diltiazem IV. MD Chandrasekra notified.  Per MD, cancel CTA.  Cardiac navigator notified.

## 2023-11-21 ENCOUNTER — Other Ambulatory Visit (HOSPITAL_COMMUNITY): Payer: Self-pay

## 2023-11-21 ENCOUNTER — Other Ambulatory Visit (HOSPITAL_COMMUNITY): Payer: Self-pay | Admitting: *Deleted

## 2023-11-21 MED ORDER — IVABRADINE HCL 7.5 MG PO TABS
ORAL_TABLET | ORAL | 0 refills | Status: DC
  Filled 2023-11-21: qty 2, 1d supply, fill #0

## 2023-11-21 MED ORDER — METOPROLOL TARTRATE 100 MG PO TABS
100.0000 mg | ORAL_TABLET | Freq: Once | ORAL | 0 refills | Status: DC
  Filled 2023-11-21: qty 1, 1d supply, fill #0

## 2023-11-24 ENCOUNTER — Telehealth (HOSPITAL_COMMUNITY): Payer: Self-pay | Admitting: *Deleted

## 2023-11-24 NOTE — Telephone Encounter (Signed)
Attempted to call patient regarding upcoming cardiac CT appointment. Left message on voicemail with name and callback number Hayley Sharpe RN Navigator Cardiac Imaging [facility] [phone] Office   

## 2023-11-25 ENCOUNTER — Encounter (INDEPENDENT_AMBULATORY_CARE_PROVIDER_SITE_OTHER): Payer: Self-pay | Admitting: Primary Care

## 2023-11-25 ENCOUNTER — Encounter (HOSPITAL_COMMUNITY): Payer: Self-pay

## 2023-11-25 ENCOUNTER — Ambulatory Visit (HOSPITAL_COMMUNITY)
Admission: RE | Admit: 2023-11-25 | Discharge: 2023-11-25 | Disposition: A | Discharge status: Home / Self Care | Payer: Medicaid Other | Source: Ambulatory Visit | Attending: Internal Medicine | Admitting: Internal Medicine

## 2023-11-25 DIAGNOSIS — E119 Type 2 diabetes mellitus without complications: Secondary | ICD-10-CM

## 2023-12-01 ENCOUNTER — Encounter: Payer: Self-pay | Admitting: Internal Medicine

## 2023-12-02 ENCOUNTER — Encounter: Payer: Self-pay | Admitting: *Deleted

## 2023-12-02 NOTE — Progress Notes (Unsigned)
 Return to work note for 12/11/23.  Coronary CT angiogram is 12/09/23.

## 2023-12-03 MED ORDER — LANTUS SOLOSTAR 100 UNIT/ML ~~LOC~~ SOPN: 10.0000 [IU] | PEN_INJECTOR | Freq: Every day | SUBCUTANEOUS | 0 refills | Status: DC

## 2023-12-08 ENCOUNTER — Telehealth (HOSPITAL_COMMUNITY): Payer: Self-pay | Admitting: *Deleted

## 2023-12-08 NOTE — Telephone Encounter (Signed)
 Reaching out to patient to offer assistance regarding upcoming cardiac imaging study; pt verbalizes understanding of appt date/time, parking situation and where to check in, pre-test NPO status and medications ordered, and verified current allergies; name and call back number provided for further questions should they arise  Chantal Requena RN Navigator Cardiac Imaging [facility] [phone] office [phone] cell  Patient to take 100mg  metoprolol  tartrate and 15mg  ivabradine  two hours prior to his cardiac CT scan. He is aware to arrive at 11 AM.

## 2023-12-09 ENCOUNTER — Ambulatory Visit (HOSPITAL_COMMUNITY)
Admission: RE | Admit: 2023-12-09 | Discharge: 2023-12-09 | Disposition: A | Discharge status: Home / Self Care | Payer: Medicaid Other | Source: Ambulatory Visit | Attending: Internal Medicine | Admitting: Internal Medicine

## 2023-12-09 ENCOUNTER — Ambulatory Visit (INDEPENDENT_AMBULATORY_CARE_PROVIDER_SITE_OTHER): Payer: Self-pay | Admitting: Primary Care

## 2023-12-09 DIAGNOSIS — E119 Type 2 diabetes mellitus without complications: Secondary | ICD-10-CM | POA: Insufficient documentation

## 2023-12-09 DIAGNOSIS — R072 Precordial pain: Secondary | ICD-10-CM | POA: Diagnosis not present

## 2023-12-09 MED ORDER — DILTIAZEM HCL 25 MG/5ML IV SOLN
10.0000 mg | INTRAVENOUS | Status: AC | PRN
  Administered 2023-12-09 (×2): 5 mg via INTRAVENOUS

## 2023-12-09 MED ORDER — METOPROLOL TARTRATE 5 MG/5ML IV SOLN: 10.0000 mg | Freq: Once | INTRAVENOUS | Status: DC | PRN

## 2023-12-09 MED ORDER — DILTIAZEM HCL 25 MG/5ML IV SOLN
INTRAVENOUS | Status: AC
Start: 2023-12-09 — End: ?
  Filled 2023-12-09: qty 5

## 2023-12-09 MED ORDER — NITROGLYCERIN 0.4 MG SL SUBL
SUBLINGUAL_TABLET | SUBLINGUAL | Status: AC
  Filled 2023-12-09: qty 2

## 2023-12-09 MED ORDER — IOHEXOL 350 MG/ML SOLN
95.0000 mL | Freq: Once | INTRAVENOUS | Status: AC | PRN
  Administered 2023-12-09: 95 mL via INTRAVENOUS

## 2023-12-09 MED ORDER — NITROGLYCERIN 0.4 MG SL SUBL
0.8000 mg | SUBLINGUAL_TABLET | Freq: Once | SUBLINGUAL | Status: AC
  Administered 2023-12-09: 0.8 mg via SUBLINGUAL

## 2023-12-10 NOTE — Progress Notes (Signed)
S:     Chief Complaint  Patient presents with   Diabetes   43 y.o. male with PMHx significant for T2DM, HLD, and HTN who presents for diabetes evaluation, education, and management.    Patient was referred and last seen by Primary Care Provider, Gwinda Passe, NP on 11/06/2023. At that time A1c was 12.2%. PCP recommended initiation of GLP-1 agonist for diabetes control.   He was seen by pharmacy clinic on 11/10/2023 for the first time. At this visit he was started on Ozempic 0.25 mg weekly and was sent a prescription for a Accu Chek Guide.   Today Aser states he feels more or less the same since last visit. He is taking Ozempic, however does not believe it has had a significant impact on his blood glucose. He has been unable to take his basal insulin since Monday due to not having pen needles.   He has not checked his blood sugar in a couple of weeks. The last reading he could recall was in 300s after a meal.   In regards to his chest pain   Family/Social History:  -Family History: HTN, DM -Tobacco: Former smoker (quit August 2024) -Alcohol: 1 drink/month  Current diabetes medications include:  -Metformin XR 500 mg BID (takes 2 and a half tablets every day at once)  -Ozempic 0.25 mg weekly (taken each Monday) -Lantus 10 units daily (last taken Monday, did not have anymore pen needles)  Current hypertension medications include:  -Valsartan/hydrochlorothiazide 160-25 mg daily   Current hyperlipidemia medications include:  -Rosuvastatin 20 mg daily (has been out of for ~week)  He has had sub-optimal adherence to insulin glargine due to lack of pen needles. He has not refilled his Rosuvastatin (he was unsure if he still required the medication due to recent cardiac work-up which was negative for ASCVD).   Have you been experiencing any side effects to the medications prescribed? no Do you have any problems obtaining medications due to transportation or finances?  no Insurance coverage: Medicaid  Patient denies hypoglycemic events.  Patient denies nocturia (nighttime urination).  Patient denies neuropathy (nerve pain). Patient denies visual changes.  Patient reported dietary habits: variable appetite   Trying to stay away from fried foods. Tries to stay away from "junk food" but has been craving more sweets since stopping smoking.   O:     Lab Results  Component Value Date   HGBA1C 12.2 (H) 10/27/2023   There were no vitals filed for this visit.  Lipid Panel     Component Value Date/Time   CHOL 238 (H) 03/18/2022 1129   TRIG 121 03/18/2022 1129   HDL 52 03/18/2022 1129   CHOLHDL 4.6 03/18/2022 1129   LDLCALC 164 (H) 03/18/2022 1129    Clinical Atherosclerotic Cardiovascular Disease (ASCVD): No  The 10-year ASCVD risk score (Arnett DK, et al., 2019) is: 9.7%   Values used to calculate the score:     Age: 45 years     Sex: Male     Is Non-Hispanic African American: Yes     Diabetic: Yes     Tobacco smoker: No     Systolic Blood Pressure: 115 mmHg     Is BP treated: Yes     HDL Cholesterol: 52 mg/dL     Total Cholesterol: 238 mg/dL   Patient is participating in a Managed Medicaid Plan:  Yes   A/P: Diabetes longstanding currently uncontrolled. A1c not at goal at 12.2% (A1c <7). This is much increased  compared to 6.7 in October 2023. last Patient is able to verbalize appropriate hypoglycemia management plan. Medication adherence appears to be okay but we have advised him to take two full tablets of metformin BID as prescribed. Additionally, we will titrate Ozempic today. -Continued basal insulin Lantus (insulin glargine) 10 units at bedtime   -Increased dose of GLP-1 Ozempic (semaglutide) to 0.5 mg weekly  -Increased dose of metformin XR 500 mg 2 tablets BID. Patient was instructed to not split tablets.   -eGFR >60 in 10/27/2023; uACR 68 in December 2024; slightly decreased from 75 in October 2023 -Patient educated on purpose,  proper use, and potential adverse effects of Ozempic.  -Extensively discussed pathophysiology of diabetes, recommended lifestyle interventions, dietary effects on blood sugar control.  -Counseled on s/sx of and management of hypoglycemia.  -Next A1c anticipated early March 2024.   Follow-up: Return to pharmacy clinic in 3-4 weeks for Ozempic dose increase follow up and consultation for possible CGM.   Sofie Rower, PharmD [facility] PGY-1

## 2023-12-11 ENCOUNTER — Ambulatory Visit: Payer: Medicaid Other | Attending: Primary Care | Admitting: Pharmacist

## 2023-12-11 ENCOUNTER — Encounter: Payer: Self-pay | Admitting: Pharmacist

## 2023-12-11 DIAGNOSIS — Z794 Long term (current) use of insulin: Secondary | ICD-10-CM | POA: Diagnosis not present

## 2023-12-11 DIAGNOSIS — Z7985 Long-term (current) use of injectable non-insulin antidiabetic drugs: Secondary | ICD-10-CM

## 2023-12-11 DIAGNOSIS — E119 Type 2 diabetes mellitus without complications: Secondary | ICD-10-CM

## 2023-12-11 DIAGNOSIS — E785 Hyperlipidemia, unspecified: Secondary | ICD-10-CM

## 2023-12-11 DIAGNOSIS — Z1159 Encounter for screening for other viral diseases: Secondary | ICD-10-CM

## 2023-12-11 DIAGNOSIS — Z760 Encounter for issue of repeat prescription: Secondary | ICD-10-CM | POA: Diagnosis not present

## 2023-12-11 DIAGNOSIS — E1169 Type 2 diabetes mellitus with other specified complication: Secondary | ICD-10-CM

## 2023-12-11 DIAGNOSIS — Z7984 Long term (current) use of oral hypoglycemic drugs: Secondary | ICD-10-CM | POA: Diagnosis not present

## 2023-12-11 MED ORDER — ROSUVASTATIN CALCIUM 20 MG PO TABS: 20.0000 mg | ORAL_TABLET | Freq: Every day | ORAL | 1 refills | Status: DC

## 2023-12-11 MED ORDER — OZEMPIC (0.25 OR 0.5 MG/DOSE) 2 MG/3ML ~~LOC~~ SOPN: 0.5000 mg | PEN_INJECTOR | SUBCUTANEOUS | 1 refills | Status: DC

## 2023-12-11 MED ORDER — VALSARTAN-HYDROCHLOROTHIAZIDE 160-25 MG PO TABS: 1.0000 | ORAL_TABLET | Freq: Every day | ORAL | 1 refills | Status: DC

## 2023-12-11 MED ORDER — METFORMIN HCL ER 500 MG PO TB24: 1000.0000 mg | ORAL_TABLET | Freq: Two times a day (BID) | ORAL | 1 refills | Status: DC

## 2023-12-11 MED ORDER — PEN NEEDLES 32G X 4 MM MISC: 6 refills | Status: DC

## 2023-12-12 LAB — LIPID PANEL
Chol/HDL Ratio: 7.7 {ratio} — ABNORMAL HIGH (ref 0.0–5.0)
Cholesterol, Total: 247 mg/dL — ABNORMAL HIGH (ref 100–199)
HDL: 32 mg/dL — ABNORMAL LOW (ref >39)
LDL Chol Calc (NIH): 172 mg/dL — ABNORMAL HIGH (ref 0–99)
Triglycerides: 228 mg/dL — ABNORMAL HIGH (ref 0–149)
VLDL Cholesterol Cal: 43 mg/dL — ABNORMAL HIGH (ref 5–40)

## 2023-12-16 ENCOUNTER — Encounter (INDEPENDENT_AMBULATORY_CARE_PROVIDER_SITE_OTHER): Payer: Self-pay | Admitting: Primary Care

## 2023-12-16 ENCOUNTER — Other Ambulatory Visit (INDEPENDENT_AMBULATORY_CARE_PROVIDER_SITE_OTHER): Payer: Self-pay | Admitting: Primary Care

## 2023-12-16 MED ORDER — ATORVASTATIN CALCIUM 80 MG PO TABS: 80.0000 mg | ORAL_TABLET | Freq: Every day | ORAL | 1 refills | Status: DC

## 2023-12-18 ENCOUNTER — Ambulatory Visit (HOSPITAL_COMMUNITY): Payer: Medicaid Other | Attending: Cardiology

## 2023-12-18 ENCOUNTER — Telehealth: Payer: Self-pay | Admitting: Internal Medicine

## 2023-12-18 ENCOUNTER — Encounter: Payer: Self-pay | Admitting: Internal Medicine

## 2023-12-18 DIAGNOSIS — R072 Precordial pain: Secondary | ICD-10-CM | POA: Diagnosis not present

## 2023-12-18 LAB — ECHOCARDIOGRAM COMPLETE
Area-P 1/2: 6.22 cm2
Calc EF: 42.6 %
S' Lateral: 2.6 cm
Single Plane A2C EF: 40.6 %
Single Plane A4C EF: 41.4 %

## 2023-12-18 NOTE — Telephone Encounter (Signed)
New message   Patient had echo today.  He want a note from Dr Lynnette Caffey stating "until further notice, he cannot work".  He works at [employer].  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  Please call pt and let him know if Dr Lynnette Caffey can write this letter.   He is also on mychart.

## 2023-12-25 ENCOUNTER — Other Ambulatory Visit: Payer: Self-pay

## 2023-12-25 DIAGNOSIS — R072 Precordial pain: Secondary | ICD-10-CM

## 2023-12-25 MED ORDER — METOPROLOL SUCCINATE ER 25 MG PO TB24: 25.0000 mg | ORAL_TABLET | Freq: Every evening | ORAL | 3 refills | Status: DC

## 2023-12-25 MED ORDER — EMPAGLIFLOZIN 10 MG PO TABS: 10.0000 mg | ORAL_TABLET | Freq: Every day | ORAL | 3 refills | Status: DC

## 2023-12-31 NOTE — Progress Notes (Signed)
 S:     No chief complaint on file.  43 y.o. male  with PMHx significant for T2DM, HLD, and HTN who presents for diabetes evaluation, education, and management.   Patient was referred and last seen by Primary Care Provider, Rosaline Bohr, NP on 11/06/2023. He was last seen by pharmacy clinic on 12/11/2023. At that time Ozempic  was increased to 0.5 mg weekly and metformin  was increased to 1000 mg BID.   Patient underwent CT chest on 12/09/2023. No evidence of CAD. Total coronary calcium  score of 0. He was started on Metoprolol  succinate 25 mg at bedtime and Jardiance  10 mg by Dr. Wendel (Cardiology).   Today he has complaints of constant heartburn causing epigastric and left sided chest-pain. This pain was present prior to starting Ozempic . He has run out of pantoprazole . He was only taking this as needed. Since then he has managed his heartburn with baking soda.   In regards to his blood sugar he has noticed his readings have decreased. Last BG was 220 (taken yesterday evening around 5 pm). He did have complaints of feeling sluggish and noticed a reduced appetite. He denied any episodes of hypoglycemia.   Current diabetes medications include:  -Lantus  10 units subcutaneous every day -Jardiance  10 mg every day (not yet started) -Metformin  1000 mg BID -Ozempic  0.5 mg subcutaneous weekly    Current hypertension medications include:  -Metoprolol  succinate 25 mg every day  -Valsartan -hydrochlorothiazide  160-25 mg every day  -Amlodipine  10 mg daily   Current hyperlipidemia medications include:  -Atorvastatin  80 mg every day (increased from 20 mg previously by PCP)  Patient reports adherence to taking all medications as prescribed.   Insurance coverage: [place] Medicaid  Patient denies hypoglycemic events.  O:   Lab Results  Component Value Date   HGBA1C 12.2 (H) 10/27/2023   There were no vitals filed for this visit.  Lipid Panel     Component Value Date/Time   CHOL 247  (H) 12/11/2023 1136   TRIG 228 (H) 12/11/2023 1136   HDL 32 (L) 12/11/2023 1136   CHOLHDL 7.7 (H) 12/11/2023 1136   LDLCALC 172 (H) 12/11/2023 1136    Clinical Atherosclerotic Cardiovascular Disease (ASCVD): No  The 10-year ASCVD risk score (Arnett DK, et al., 2019) is: 11.3%   Values used to calculate the score:     Age: 49 years     Sex: Male     Is Non-Hispanic African American: Yes     Diabetic: Yes     Tobacco smoker: No     Systolic Blood Pressure: 115 mmHg     Is BP treated: Yes     HDL Cholesterol: 32 mg/dL     Total Cholesterol: 247 mg/dL   Patient is participating in a Managed Medicaid Plan:  Yes   A/P: Diabetes longstanding currently uncontrolled. A1c 12.2 in December 2024 (goal <7%). Self reported home BG readings have improved from 300s to 220. Patient endorses his BG has been decreasing. Denied hypoglycemia.  -Due to multiple recent medication changes in the last few weeks and complaints of heartburn we elected to continue current dose of Ozempic  for now.  -Continued basal insulin  Lantus  10 units at bedtime   -Continued Ozempic  (semaglutide )  0.5 mg subcutaneous weekly   -Started SGLT2-I Jardiance  (empagliflozin ) 10 mg. Counseled on sick day rules. -Continued metformin  1000 mg BID.  -For heartburn patient was advised to restart Pantoprazole  40 mg each morning 30 minutes prior to breakfast. He was counseled that this medication  is not to be taken as needed and he would need to take it consistently to benefit  -Extensively discussed pathophysiology of diabetes, recommended lifestyle interventions, dietary effects on blood sugar control.  -Counseled on s/sx of and management of hypoglycemia.  -Next A1c anticipated March 2025.   Follow-up: In 2 months with Herlene for diabetes follow up   Steve Nelson, PharmD [employer] PGY-1

## 2024-01-01 ENCOUNTER — Ambulatory Visit: Payer: Medicaid Other | Attending: Primary Care | Admitting: Pharmacist

## 2024-01-01 ENCOUNTER — Telehealth: Payer: Self-pay | Admitting: Pharmacist

## 2024-01-01 ENCOUNTER — Encounter: Payer: Self-pay | Admitting: Pharmacist

## 2024-01-01 ENCOUNTER — Other Ambulatory Visit: Payer: Self-pay

## 2024-01-01 DIAGNOSIS — Z7984 Long term (current) use of oral hypoglycemic drugs: Secondary | ICD-10-CM

## 2024-01-01 DIAGNOSIS — Z7985 Long-term (current) use of injectable non-insulin antidiabetic drugs: Secondary | ICD-10-CM

## 2024-01-01 DIAGNOSIS — Z794 Long term (current) use of insulin: Secondary | ICD-10-CM

## 2024-01-01 DIAGNOSIS — E119 Type 2 diabetes mellitus without complications: Secondary | ICD-10-CM | POA: Diagnosis not present

## 2024-01-01 MED ORDER — PANTOPRAZOLE SODIUM 40 MG PO TBEC: 40.0000 mg | DELAYED_RELEASE_TABLET | Freq: Every day | ORAL | 1 refills | Status: DC

## 2024-01-01 NOTE — Telephone Encounter (Signed)
 Hey friend,   Can we start this patient's PA for Jardiance ?

## 2024-01-02 ENCOUNTER — Other Ambulatory Visit (HOSPITAL_COMMUNITY): Payer: Self-pay

## 2024-01-02 ENCOUNTER — Telehealth: Payer: Self-pay | Admitting: Pharmacy Technician

## 2024-01-02 ENCOUNTER — Other Ambulatory Visit: Payer: Self-pay

## 2024-01-02 NOTE — Telephone Encounter (Signed)
 Pharmacy Patient Advocate Encounter   Received notification from CoverMyMeds that prior authorization for Jardiance  is required/requested.   Insurance verification completed.   The patient is insured through [facility] .   Per test claim: PA required; PA submitted to above mentioned insurance via CoverMyMeds Key/confirmation #/EOC [id number] Status is pending

## 2024-01-02 NOTE — Telephone Encounter (Signed)
 Pharmacy Patient Advocate Encounter  Received notification from [facility] that Prior Authorization for Jardiance  has been APPROVED from 01/02/24 to 01/01/25. Ran test claim, Copay is $4.00 one month. This test claim was processed through [facility]- copay amounts may vary at other pharmacies due to pharmacy/plan contracts, or as the patient moves through the different stages of their insurance plan.   PA #/Case ID/Reference #: [id number]

## 2024-01-07 ENCOUNTER — Encounter (INDEPENDENT_AMBULATORY_CARE_PROVIDER_SITE_OTHER): Payer: Self-pay | Admitting: Primary Care

## 2024-01-12 ENCOUNTER — Other Ambulatory Visit (HOSPITAL_COMMUNITY): Payer: Self-pay

## 2024-01-13 ENCOUNTER — Ambulatory Visit: Payer: Self-pay | Admitting: Pharmacist

## 2024-01-29 ENCOUNTER — Other Ambulatory Visit: Payer: Self-pay

## 2024-01-29 ENCOUNTER — Telehealth (INDEPENDENT_AMBULATORY_CARE_PROVIDER_SITE_OTHER): Payer: Self-pay | Admitting: Primary Care

## 2024-01-29 ENCOUNTER — Other Ambulatory Visit (HOSPITAL_COMMUNITY): Payer: Self-pay

## 2024-01-29 DIAGNOSIS — S6991XA Unspecified injury of right wrist, hand and finger(s), initial encounter: Secondary | ICD-10-CM | POA: Diagnosis not present

## 2024-01-29 DIAGNOSIS — M79641 Pain in right hand: Secondary | ICD-10-CM | POA: Diagnosis not present

## 2024-01-29 NOTE — Telephone Encounter (Signed)
 Spoke to pt about appt.. Will be present

## 2024-01-29 NOTE — Progress Notes (Signed)
   Subjective:   Chief Complaint  Patient presents with  . Finger Pain    Right finger pain for about a month. Doesn't think think he did anything to injure it. Pt states the pain isn't going away. No OTC meds taken to help.      History of Present Illness The patient is a 43 year old male presenting with finger pain.  He has been experiencing persistent pain in his right index finger for over a month, which appears to radiate into the volar forearm at times. The pain is intermittent, worse with palpation or striking it on something, has not tried anything for relief. He reports no history of trauma or injury to the finger. There was an initial swelling, but it has since subsided.      Parts of patient history reviewed include PMH, problem list, medications, allergies, and social history.  Objective:   Vitals:   01/29/24 1228  BP: 121/82  Pulse: 84  Temp: 99.8 F (37.7 C)  TempSrc: Oral  Weight: 99.8 kg (220 lb)  Height: 1.676 m (5' 6)    Physical Exam Vitals reviewed.  Constitutional:      Appearance: Normal appearance.  Eyes:     Extraocular Movements: Extraocular movements intact.  Cardiovascular:     Rate and Rhythm: Normal rate.     Pulses: Normal pulses.  Pulmonary:     Effort: Pulmonary effort is normal.  Musculoskeletal:        General: Normal range of motion.     Comments: Right hand: Mild TTP to the palmar surface of the index finger MCP region FROM of finger Neurovascularly intact distally No swelling nor erythema   Skin:    General: Skin is warm and dry.     Findings: No erythema.  Neurological:     General: No focal deficit present.     Mental Status: He is alert and oriented to person, place, and time.            Assessment/Plan:   Steve Nelson was seen today for finger pain.  Diagnoses and all orders for this visit:  Right hand pain -     XR Hand Minimum 3 Views Right  Injury of right index finger, initial encounter -     meloxicam  (MOBIC) 15 mg tablet; Take 1 tablet daily with food for 7 days and then daily PRN -     Ambulatory referral to Orthopedic Surgery; Future     Assessment & Plan 1. Pain in the right index finger. Xray reveals avulsed fracture fragment involving the prox phalanx of the index finger.  Discussed findings with patient and he does not recall a specific trauma/injury that would have led to findings. Given it has been one month since pain started and swelling has subsided with no limitation in ROM nor obvious strength deficits, do not feel splinting would be useful. Recommend trial of mobic and f/u with hand surgeon--referral placed.  Patient will return for new or worsening symptoms. I discussed the findings today, diagnosis/differential diagnosis, plan and red flags that require return for reevaluation with PCP, Urgent care or EMERGENCY. Patient was agreeable to outlined plan and questions were answered, felt stable for discharge.    Home Care   Electronically signed by: Steve Earnie Flank, NP 01/29/2024 2:05 PM

## 2024-02-03 ENCOUNTER — Telehealth (INDEPENDENT_AMBULATORY_CARE_PROVIDER_SITE_OTHER): Payer: Self-pay | Admitting: Primary Care

## 2024-02-03 NOTE — Telephone Encounter (Signed)
 Spoke to pt about appt.. Will be present

## 2024-02-04 ENCOUNTER — Ambulatory Visit (INDEPENDENT_AMBULATORY_CARE_PROVIDER_SITE_OTHER): Payer: Medicaid Other | Admitting: Primary Care

## 2024-02-04 ENCOUNTER — Encounter (INDEPENDENT_AMBULATORY_CARE_PROVIDER_SITE_OTHER): Payer: Self-pay | Admitting: Primary Care

## 2024-02-04 VITALS — BP 128/88 | HR 74

## 2024-02-04 DIAGNOSIS — E119 Type 2 diabetes mellitus without complications: Secondary | ICD-10-CM | POA: Diagnosis not present

## 2024-02-04 DIAGNOSIS — Z7985 Long-term (current) use of injectable non-insulin antidiabetic drugs: Secondary | ICD-10-CM | POA: Diagnosis not present

## 2024-02-04 LAB — GLUCOSE, POCT (MANUAL RESULT ENTRY): POC Glucose: 108 mg/dL — AB (ref 70–99)

## 2024-02-04 LAB — POCT GLYCOSYLATED HEMOGLOBIN (HGB A1C): HbA1c, POC (controlled diabetic range): 9.8 % — AB (ref 0.0–7.0)

## 2024-02-04 MED ORDER — OZEMPIC (0.25 OR 0.5 MG/DOSE) 2 MG/3ML ~~LOC~~ SOPN: 0.5000 mg | PEN_INJECTOR | SUBCUTANEOUS | 1 refills | Status: DC

## 2024-02-04 NOTE — Progress Notes (Signed)
 Subjective:  Patient ID: Steve Nelson, male    DOB: 1981-07-02  Age: 43 y.o. MRN: [id number]  CC: Diabetes   Steve Nelson presents for follow-up of diabetes. Patient does not check blood sugar at home  Compliant with meds - Yes Checking CBGs? Yes  Fasting avg - 120-180  Postprandial average -  Exercising regularly? - Yes Watching carbohydrate intake? - Yes Neuropathy ? - No Hypoglycemic events - No  - Recovers with :   Pertinent ROS:  Polyuria - No Polydipsia - No Vision problems - No He also voiced concern about pain in his right finger that feels like a nerve that is he has appointment tomorrow with Ortho hand specialist. Medications as noted below. Taking them regularly without complication/adverse reaction being reported today.   History Steve Nelson has a past medical history of Chest pain, DM (diabetes mellitus) (HCC), HLD (hyperlipidemia), HTN (hypertension), and Hyperglycemia.   He has no past surgical history on file.   His family history includes Diabetes in his mother; Hypertension in his mother.He reports that he quit smoking about 6 months ago. His smoking use included cigarettes. He has never used smokeless tobacco. He reports that he does not currently use alcohol. He reports that he does not use drugs.  Current Outpatient Medications on File Prior to Visit  Medication Sig Dispense Refill   Accu-Chek Softclix Lancets lancets Use to check blood sugar 3 times daily. 100 each 6   amLODipine (NORVASC) 10 MG tablet TAKE 1 TABLET(10 MG) BY MOUTH DAILY 90 tablet 1   aspirin EC 81 MG tablet Take 1 tablet (81 mg total) by mouth daily. Swallow whole. 30 tablet 5   atorvastatin (LIPITOR) 80 MG tablet Take 1 tablet (80 mg total) by mouth daily. 90 tablet 1   Blood Glucose Monitoring Suppl (ACCU-CHEK GUIDE) w/Device KIT Use to check blood sugar 3 times daily. 1 kit 0   empagliflozin (JARDIANCE) 10 MG TABS tablet Take 1 tablet (10 mg total) by mouth daily. 30 tablet 3    glucose blood (ACCU-CHEK GUIDE TEST) test strip Use to check blood sugar 3 times daily. 100 each 6   insulin glargine (LANTUS SOLOSTAR) 100 UNIT/ML Solostar Pen Inject 10 Units into the skin daily. 15 mL 0   Insulin Pen Needle (PEN NEEDLES) 32G X 4 MM MISC Use as instructed to inject insulin once daily. 100 each 6   ivabradine (CORLANOR) 7.5 MG TABS tablet Take 2 tablets (15mg ) by mouth TWO hours prior to your cardiac CT scan. 2 tablet 0   metFORMIN (GLUCOPHAGE-XR) 500 MG 24 hr tablet Take 2 tablets (1,000 mg total) by mouth 2 (two) times daily with a meal. 360 tablet 1   metoprolol succinate (TOPROL XL) 25 MG 24 hr tablet Take 1 tablet (25 mg total) by mouth at bedtime. 30 tablet 3   pantoprazole (PROTONIX) 40 MG tablet Take 1 tablet (40 mg total) by mouth daily. 90 tablet 1   valsartan-hydrochlorothiazide (DIOVAN-HCT) 160-25 MG tablet Take 1 tablet by mouth daily. 90 tablet 1   XIIDRA 5 % SOLN Apply 1 drop to eye 2 (two) times daily.     No current facility-administered medications on file prior to visit.    Review of Systems Comprehensive ROS Pertinent positive and negative noted in HPI   Objective:   BP Readings from Last 3 Encounters:  02/04/24 128/88  12/09/23 115/65  11/25/23 113/82    Wt Readings from Last 3 Encounters:  11/07/23 216 lb 9.6 oz (98.2 kg)  11/06/23 220 lb (99.8 kg)  09/25/23 235 lb 12.8 oz (107 kg)    Physical Exam Vitals reviewed.  Constitutional:      Appearance: He is obese.  HENT:     Head: Normocephalic.     Right Ear: Tympanic membrane and external ear normal.     Left Ear: Tympanic membrane and external ear normal.     Nose: Nose normal.  Eyes:     Extraocular Movements: Extraocular movements intact.     Pupils: Pupils are equal, round, and reactive to light.  Cardiovascular:     Rate and Rhythm: Normal rate and regular rhythm.  Pulmonary:     Effort: Pulmonary effort is normal.     Breath sounds: Normal breath sounds.  Abdominal:      General: Bowel sounds are normal. There is distension.     Palpations: Abdomen is soft.  Musculoskeletal:        General: Normal range of motion.  Skin:    General: Skin is warm and dry.  Neurological:     Mental Status: He is oriented to person, place, and time.  Psychiatric:        Mood and Affect: Mood normal.        Behavior: Behavior normal.        Thought Content: Thought content normal.        Judgment: Judgment normal.     Lab Results  Component Value Date   HGBA1C 9.8 (A) 02/04/2024   HGBA1C 12.2 (H) 10/27/2023   HGBA1C 7.2 (A) 08/28/2023    Lab Results  Component Value Date   WBC 7.4 10/27/2023   HGB 15.3 10/27/2023   HCT 44.0 10/27/2023   PLT 361 10/27/2023   GLUCOSE 466 (H) 10/27/2023   CHOL 247 (H) 12/11/2023   TRIG 228 (H) 12/11/2023   HDL 32 (L) 12/11/2023   LDLCALC 172 (H) 12/11/2023   ALT 33 10/27/2023   AST 19 10/27/2023   NA 126 (L) 10/27/2023   K 4.0 10/27/2023   CL 96 (L) 10/27/2023   CREATININE 1.07 10/27/2023   BUN 21 (H) 10/27/2023   CO2 21 (L) 10/27/2023   TSH 1.230 10/18/2019   HGBA1C 9.8 (A) 02/04/2024     Assessment & Plan:  Steve Nelson was seen today for diabetes.  Diagnoses and all orders for this visit:  Type 2 diabetes mellitus without complication, without long-term current use of insulin (HCC) -     POCT glucose (manual entry) -     POCT glycosylated hemoglobin (Hb A1C) A1c 9.8 previously 12.2 not at goal in the right direction. - educated on lifestyle modifications, including but not limited to diet choices and adding exercise to daily routine.    Other orders -     Semaglutide,0.25 or 0.5MG /DOS, (OZEMPIC, 0.25 OR 0.5 MG/DOSE,) 2 MG/3ML SOPN; Inject 0.5 mg into the skin once a week.     Follow-up:  Return in about 3 months (around 05/06/2024) for fasting labs/DM.  The above assessment and management plan was discussed with the patient. The patient verbalized understanding of and has agreed to the management plan. Patient  is aware to call the clinic if symptoms fail to improve or worsen. Patient is aware when to return to the clinic for a follow-up visit. Patient educated on when it is appropriate to go to the emergency department.   Gwinda Passe, NP-C

## 2024-02-05 DIAGNOSIS — S6991XA Unspecified injury of right wrist, hand and finger(s), initial encounter: Secondary | ICD-10-CM | POA: Diagnosis not present

## 2024-02-05 DIAGNOSIS — M65321 Trigger finger, right index finger: Secondary | ICD-10-CM | POA: Diagnosis not present

## 2024-02-05 NOTE — Progress Notes (Signed)
 Plastic and Reconstructive Surgery Clinic Note  Chief Complaint: No chief complaint on file.    Referred by: Thad  History of Present Illness:  Subjective  Steve Nelson is a 43 y.o. male who presents for right index finger pain present for the past 5 weeks. He denies any know trauma or inciting event, but recently underwent XR which showed avulsion fracture of the radial base of the right IF proximal phalanx. His PCP deferred splint placement due to the delayed presentation and referred patient here today.  He notes new pain over the past week within the palm of the hand at the base of the index finger    Past Medical History:  Past Medical History:  Diagnosis Date  . Diabetes mellitus (CMD)   . Hypertension   . Hypertension     Problem List There is no problem list on file for this patient.   Past Surgical History: No past surgical history on file.  Social History: Social History   Tobacco Use  . Smoking status: Former    Types: Cigarettes  . Smokeless tobacco: Never  Substance Use Topics  . Alcohol use: Not Currently    Family History: family history is not on file.  Allergies: No Known Allergies  Medications: has a current medication list which includes the following prescription(s): accu-chek softclix lancets, aspirin , atorvastatin , bd nano 2nd gen pen needle, empagliflozin , lantus  solostar u-100 insulin , meloxicam, metformin , metoprolol  succinate, ozempic , pantoprazole , and valsartan -hydrochlorothiazide .  Review of Systems: A 14 point review of systems was obtained and negative unless otherwise stated in the HPI  Physical Examination:  vitals were not taken for this visit.  There is no height or weight on file to calculate BMI.  Gen: well appearing male in no acute distress Psych: alert, normal mood/affect Neuro: follows commands, CN VII function symmetric Eyes: pupils equal, round ENT:  mucous membranes moist and pink Resp: breathing comfortably on  room air, non-labored, no distress CV: no clubbing, no cyanosis Skin: warm, no systemic rashes Extremity: TTP overlying right IF A1 pulley, no significant TTP overlying proximal phalanx Sensation: LT sensation present M/R/U Vascular: Brisk capillary refill     Imaging X-RAY HAND RIGHT (3+ VIEWS), 01/29/2024 1:08 PM   INDICATION: Pain in right hand \ M79.641 Pain in right hand  COMPARISON: None.   IMPRESSION: There is avulsed fracture fragment off the base of the second proximal phalanx. No joint dislocation. There is remote fracture deformity of the fifth metacarpal.   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 Patient instructed to continue guarded ROM and use of the digit -Regarding trigger digits, we discussed the diagnosis and relevant anatomy as well as the treatment options available, including observation, splinting, steroid injections, and surgical release of the A1 pulley.  I recommend a steroid injection. The risks and benefits of the corticosteroid injection were discussed with the patient, including but not limited to infection, tendon rupture, fat atrophy, skin depigmentation, and the need for further intervention.   Tendon Sheath Injection: R index A1 on 02/05/2024 10:30 AM Indications: pain Details: 25 G needle, volar approach Medications: 10 mg lidocaine 10 mg/mL (1 %); 40 mg triamcinolone  acetonide 40 mg/mL Outcome: patient tolerated the procedure well with  no immediate complications Procedure, treatment alternatives, risks and benefits explained, specific risks discussed. Consent was given by the patient. Immediately prior to procedure a time out was called to  verify the correct patient, procedure, equipment, support staff and site/side marked as required. Patient was prepped and draped in the usual sterile fashion.        Steve JULIANNA Hock, MD  Plastic & Reconstructive Surgery Hand & Upper Extremity Surgery [facility] 02/05/2024 7:42 AM

## 2024-02-10 ENCOUNTER — Other Ambulatory Visit (HOSPITAL_COMMUNITY): Payer: Self-pay

## 2024-02-10 ENCOUNTER — Other Ambulatory Visit (HOSPITAL_COMMUNITY): Payer: Self-pay | Admitting: *Deleted

## 2024-02-19 ENCOUNTER — Ambulatory Visit (HOSPITAL_COMMUNITY): Attending: Cardiology

## 2024-02-19 ENCOUNTER — Encounter: Payer: Self-pay | Admitting: Internal Medicine

## 2024-02-19 DIAGNOSIS — R072 Precordial pain: Secondary | ICD-10-CM | POA: Insufficient documentation

## 2024-02-19 LAB — ECHOCARDIOGRAM COMPLETE
Area-P 1/2: 4.21 cm2
S' Lateral: 2.7 cm

## 2024-03-04 ENCOUNTER — Telehealth: Payer: Self-pay | Admitting: Pharmacist

## 2024-03-04 ENCOUNTER — Encounter: Payer: Self-pay | Admitting: Pharmacist

## 2024-03-04 ENCOUNTER — Ambulatory Visit: Payer: Medicaid Other | Attending: Primary Care | Admitting: Pharmacist

## 2024-03-04 ENCOUNTER — Other Ambulatory Visit: Payer: Self-pay

## 2024-03-04 DIAGNOSIS — Z1159 Encounter for screening for other viral diseases: Secondary | ICD-10-CM | POA: Diagnosis not present

## 2024-03-04 MED ORDER — TRULICITY 0.75 MG/0.5ML ~~LOC~~ SOAJ: 0.7500 mg | SUBCUTANEOUS | 1 refills | Status: DC

## 2024-03-04 MED ORDER — METOPROLOL SUCCINATE ER 25 MG PO TB24: 25.0000 mg | ORAL_TABLET | Freq: Every evening | ORAL | 3 refills | Status: DC

## 2024-03-04 MED ORDER — CETIRIZINE HCL 10 MG PO TABS: 10.0000 mg | ORAL_TABLET | Freq: Every day | ORAL | 0 refills | Status: DC

## 2024-03-04 MED ORDER — VALSARTAN-HYDROCHLOROTHIAZIDE 160-25 MG PO TABS: 1.0000 | ORAL_TABLET | Freq: Every day | ORAL | 1 refills | Status: DC

## 2024-03-04 MED ORDER — FLUTICASONE PROPIONATE 50 MCG/ACT NA SUSP
2.0000 | Freq: Every day | NASAL | 2 refills | Status: AC
Start: 2024-03-04 — End: ?

## 2024-03-04 NOTE — Telephone Encounter (Signed)
 Can we submit a PA for this patient's Trulicity?

## 2024-03-04 NOTE — Progress Notes (Signed)
    S:     No chief complaint on file.  43 y.o. male  with PMHx significant for T2DM, HLD, and HTN who presents for diabetes evaluation, education, and management.   Patient was referred and last seen by Primary Care Provider, Gwinda Passe, NP on 02/04/2024. A1c at that visit was 9.8% (down from 12.2 in Dec).  Today, he reports doing okay. Heartburn/GERD-associated symptoms have improved with pantoprazole. Still having some bloating and gas. Denies any NV, abdominal pain, or changes in vision. In regards to his blood sugar, I commended him for his A1c improvement.   Current diabetes medications include:  -Lantus 10 units subcutaneous every day -Jardiance 10 mg every day -Metformin 1000 mg BID (takes 500 mg XR BID) -Ozempic 0.5 mg subcutaneous weekly (using 0.25mg )  Insurance coverage: [place] [employer]  Patient denies hypoglycemic events.  Reported home sugars: 100s. Does sometimes "feel off" when numbers are in the 120s-130s.  O:   Lab Results  Component Value Date   HGBA1C 9.8 (A) 02/04/2024   There were no vitals filed for this visit.  Lipid Panel     Component Value Date/Time   CHOL 247 (H) 12/11/2023 1136   TRIG 228 (H) 12/11/2023 1136   HDL 32 (L) 12/11/2023 1136   CHOLHDL 7.7 (H) 12/11/2023 1136   LDLCALC 172 (H) 12/11/2023 1136    Clinical Atherosclerotic Cardiovascular Disease (ASCVD): No  The 10-year ASCVD risk score (Arnett DK, et al., 2019) is: 13.7%   Values used to calculate the score:     Age: 68 years     Sex: Male     Is Non-Hispanic African American: Yes     Diabetic: Yes     Tobacco smoker: No     Systolic Blood Pressure: 128 mmHg     Is BP treated: Yes     HDL Cholesterol: 32 mg/dL     Total Cholesterol: 247 mg/dL   Patient is participating in a Managed Medicaid Plan:  Yes   A/P: Diabetes longstanding currently uncontrolled but improving He is not currently symptomatic from a hyperglycemia standpoint. He does have some relative hypoglycemia  in the low 100s. Given his GI symptoms, we will try to change from Ozempic to Trulicity. He injects Ozempic on Mondays. I would like to leave next week free of GLP-1 RA and then change to low-dose Trulicity the following week. Pt is amenable to this.  -Stop Ozempic. No GLP-1 RA next week.  -Start Trulicity 0.75 mg weekly 03/15/24. -Continued basal insulin Lantus 10 units at bedtime   -Continued SGLT2-I Jardiance (empagliflozin) 10 mg. Counseled on sick day rules. -Continued metformin 1000 mg BID.  -Extensively discussed pathophysiology of diabetes, recommended lifestyle interventions, dietary effects on blood sugar control.  -Counseled on s/sx of and management of hypoglycemia.  -Next A1c anticipated 04/2024.   Follow-up: In 2 months with Franky Macho for diabetes follow up   Butch Penny, PharmD, [place], CPP Clinical Pharmacist [facility] [phone]

## 2024-03-05 ENCOUNTER — Other Ambulatory Visit (HOSPITAL_COMMUNITY): Payer: Self-pay

## 2024-03-05 ENCOUNTER — Other Ambulatory Visit: Payer: Self-pay

## 2024-03-15 ENCOUNTER — Other Ambulatory Visit (HOSPITAL_COMMUNITY): Payer: Self-pay

## 2024-03-31 ENCOUNTER — Other Ambulatory Visit: Payer: Self-pay

## 2024-03-31 ENCOUNTER — Other Ambulatory Visit: Payer: Self-pay | Admitting: Pharmacist

## 2024-03-31 MED ORDER — ATORVASTATIN CALCIUM 80 MG PO TABS: 80.0000 mg | ORAL_TABLET | Freq: Every day | ORAL | 1 refills | Status: DC

## 2024-04-01 ENCOUNTER — Telehealth: Payer: Self-pay | Admitting: Pharmacist

## 2024-04-01 ENCOUNTER — Ambulatory Visit: Attending: Primary Care | Admitting: Pharmacist

## 2024-04-01 ENCOUNTER — Encounter: Payer: Self-pay | Admitting: Pharmacist

## 2024-04-01 ENCOUNTER — Other Ambulatory Visit: Payer: Self-pay

## 2024-04-01 DIAGNOSIS — Z794 Long term (current) use of insulin: Secondary | ICD-10-CM | POA: Diagnosis not present

## 2024-04-01 DIAGNOSIS — Z7984 Long term (current) use of oral hypoglycemic drugs: Secondary | ICD-10-CM | POA: Diagnosis not present

## 2024-04-01 DIAGNOSIS — Z7985 Long-term (current) use of injectable non-insulin antidiabetic drugs: Secondary | ICD-10-CM

## 2024-04-01 DIAGNOSIS — E119 Type 2 diabetes mellitus without complications: Secondary | ICD-10-CM | POA: Insufficient documentation

## 2024-04-01 MED ORDER — FREESTYLE LIBRE 3 READER DEVI
0 refills | Status: AC
Start: 2024-04-01 — End: ?

## 2024-04-01 MED ORDER — TRULICITY 1.5 MG/0.5ML ~~LOC~~ SOAJ: 1.5000 mg | SUBCUTANEOUS | 3 refills | Status: AC

## 2024-04-01 MED ORDER — CETIRIZINE HCL 10 MG PO TABS: 10.0000 mg | ORAL_TABLET | Freq: Every day | ORAL | 1 refills | Status: AC

## 2024-04-01 MED ORDER — FREESTYLE LIBRE 3 SENSOR MISC: 6 refills | Status: AC

## 2024-04-01 NOTE — Progress Notes (Signed)
 S:     No chief complaint on file.  43 y.o. male  with PMHx significant for T2DM, HLD, and HTN who presents for diabetes evaluation, education, and management.   Patient was referred and last seen by Primary Care Provider, Madelyn Schick, NP on 02/04/2024. A1c at that visit was 9.8% (down from 12.2% in Dec). I last saw him on 03/04/2024. He was still having some GI discomfort with Ozempic . We stopped this and changed him to Trulicity.  Today, he reports doing well. Since changing to Trulicity, pt denies any NV, abdominal pain. Tells us  that gas/bloating has resolved. He does admit that he has some increased blurring in his vision. Endorses baseline paraesthesia with no changes since last visit. Denies any polyuria. or changes in vision. He has completed 3 injections of Trulicity with 1 pen left on his current rxn. In regards to his blood sugar, he still is not checking at home. Admits today that he has not checked his blood sugar at home for the past several months. Of note, I wrote intranasal fluticasone  and PO cetirizine  for him ~1 month ago given allergic rhinitis and hay fever symptoms. He tells me today that these helped resolve symptoms at first. Unfortunately, he stopped IN fluticasone  and his symptoms returned. He did continue taking daily PO cetirizine . Is also taking Miralax daily and tells us  he only takes metformin  BID instead of two tablets BID due to diarrhea with the higher dose.   Current diabetes medications include:  -Lantus  10 units subcutaneous every day -Jardiance  10 mg every day -Metformin  1000 mg BID (takes 500 mg XR BID) -Trulicity 0.75 mg weekly   Insurance coverage: [place] [employer]  Patient denies hypoglycemic events. Reported home sugars: none  Patient-reported dietary habits:  -Overall, he seems to be doing better with his improvement in A1c since Jan of this year. -Of note, does admit to dietary indiscretion. Gives example of slice of chocolate cake  he ate yesterday.   Patient-reported exercise habits:  -None reported   O:   Lab Results  Component Value Date   HGBA1C 9.8 (A) 02/04/2024   There were no vitals filed for this visit.  Lipid Panel     Component Value Date/Time   CHOL 247 (H) 12/11/2023 1136   TRIG 228 (H) 12/11/2023 1136   HDL 32 (L) 12/11/2023 1136   CHOLHDL 7.7 (H) 12/11/2023 1136   LDLCALC 172 (H) 12/11/2023 1136    Clinical Atherosclerotic Cardiovascular Disease (ASCVD): No  The 10-year ASCVD risk score (Arnett DK, et al., 2019) is: 13.7%   Values used to calculate the score:     Age: 42 years     Sex: Male     Is Non-Hispanic African American: Yes     Diabetic: Yes     Tobacco smoker: No     Systolic Blood Pressure: 128 mmHg     Is BP treated: Yes     HDL Cholesterol: 32 mg/dL     Total Cholesterol: 247 mg/dL   Patient is participating in a Managed Medicaid Plan:  Yes   A/P: Diabetes longstanding currently uncontrolled but improving. We do not have any home CBG readings, however, he is interested in CGM services. We will pursue a PA for that today. He has minimal hyperglycemia-associated symptoms with no changes since last visit. Of note, he does tell us  he "knows when" his sugar is high. Given his improvement in GI symptoms since changing to Trulicity, we will increase to  the 1.5mg  weekly dose today. He is amenable to this.  Recommend he continue current doses of glargine, metformin , and empagliflozin .  -Increase Trulicity to 1.5 mg weekly. -Continued basal insulin  Lantus  10 units at bedtime   -Continued SGLT2-I Jardiance  (empagliflozin ) 10 mg. Counseled on sick day rules. -Continued metformin  1000 mg BID.  -Extensively discussed pathophysiology of diabetes, recommended lifestyle interventions, dietary effects on blood sugar control.  -Counseled on s/sx of and management of hypoglycemia.  -Next A1c anticipated 04/2024.   ASCVD risk - primary prevention in patient with diabetes. Last LDL is not at  goal of <70 mg/dL (045 mg/dL in Jan of this year). 10-year ASCVD risk score of 13.7%. High intensity statin indicated - he is currently adherent to high intensity atorvastatin .   -Continued atorvastatin  80 mg.  -Repeat lipid panel at PCP visit next month. He has been adherent to atorvastatin  now for several months.  Allergic rhinitis - patient with initial improvement of symptoms on intranasal steroid and PO antihistamine. Symptom recurrence off of fluticasone . I have advised his to restart Flonase , 2 sprays in each nostril BID. Recommend to continue daily PO cetirizine .  -Continue cetirizine  10 mg daily.  -Restart fluticasone  50 mcg per actuation; 2 actuations in each nostril BID.   Follow-up: In 2 months with [facility] for diabetes follow up PCP: Moira Andrews 05/06/2024  Marene Shape, PharmD, BCACP, CPP Clinical Pharmacist [facility] [phone]

## 2024-04-01 NOTE — Telephone Encounter (Signed)
 Can we start a PA for this patient's libre 3?

## 2024-04-02 ENCOUNTER — Other Ambulatory Visit: Payer: Self-pay

## 2024-04-04 ENCOUNTER — Other Ambulatory Visit (INDEPENDENT_AMBULATORY_CARE_PROVIDER_SITE_OTHER): Payer: Self-pay | Admitting: Family Medicine

## 2024-04-06 NOTE — Telephone Encounter (Signed)
 Requested Prescriptions  Pending Prescriptions Disp Refills   LANTUS  SOLOSTAR 100 UNIT/ML Solostar Pen [Pharmacy Med Name: LANTUS  SOLOSTAR PEN INJ 3ML] 15 mL 0    Sig: INJECT 10 UNITS UNDER THE SKIN DAILY     Endocrinology:  Diabetes - Insulins Failed - 04/06/2024  2:27 PM      Failed - HBA1C is between 0 and 7.9 and within 180 days    HbA1c, POC (controlled diabetic range)  Date Value Ref Range Status  02/04/2024 9.8 (A) 0.0 - 7.0 % Final         Passed - Valid encounter within last 6 months    Recent Outpatient Visits           5 days ago Type 2 diabetes mellitus without complication, with long-term current use of insulin  (HCC)   [facility] [facility] A Dept Of [place]. [facility] Valente Gaskin, RPH-CPP   1 month ago Need for hepatitis C screening test   [facility] - A Dept Of [place]. [facility] Freada Jacobs, [place] L, RPH-CPP   2 months ago Type 2 diabetes mellitus without complication, without long-term current use of insulin  [facility])   [place] [facility] Marius Siemens, NP   3 months ago Type 2 diabetes mellitus without complication, without long-term current use of insulin  [facility])   [place] [facility] - A Dept Of [place]. [facility] Freada Jacobs, [place] L, RPH-CPP   3 months ago Type 2 diabetes mellitus without complication, without long-term current use of insulin  [facility])   [place] [facility] - A Dept Of [place]. [facility] Valente Gaskin, RPH-CPP       Future Appointments             In 1 month Denece Finger, Meade Spencer, NP [place] [facility]   In 1 month Lorie Rook, Arun K, MD [facility] A Dept of [employer]. [facility] [employer], [employer]

## 2024-04-08 ENCOUNTER — Other Ambulatory Visit: Payer: Self-pay

## 2024-04-08 ENCOUNTER — Other Ambulatory Visit: Payer: Self-pay | Admitting: Internal Medicine

## 2024-04-11 ENCOUNTER — Encounter (INDEPENDENT_AMBULATORY_CARE_PROVIDER_SITE_OTHER): Payer: Self-pay | Admitting: Primary Care

## 2024-04-12 DIAGNOSIS — L03011 Cellulitis of right finger: Secondary | ICD-10-CM | POA: Diagnosis not present

## 2024-04-12 NOTE — Progress Notes (Signed)
 Subjective:   Chief Complaint  Patient presents with  . Nail Problem    Pt states he had an ingrown nail on right middle finger that he tried to pull out himself two weeks ago. Pt reports increasing pain to finger and states finger is hot to the touch. Pt states he has tried soaking the finger with no relief.      History of Present Illness 43 year old male with diabetes presents with painful, swollen finger and nail problem. He reports removing a hangnail weeks ago, leading to swelling, discoloration, and pain. Soaking the finger yesterday provided temporary relief, but pain recurred, rated as 20/10 at present time.   Parts of patient history reviewed include PMH, problem list, medications, allergies, and social history.  Objective:   Vitals:   04/12/24 0756 04/12/24 0803  BP: (!) 147/102 (!) 154/103  BP Location: Left arm Right arm  Pulse: 92   Resp: 12   Temp: 98.1 F (36.7 C)   TempSrc: Oral   SpO2: 98%   Weight: 99.8 kg (220 lb)   Height: 1.702 m (5' 7)     Physical Exam Vitals reviewed.  Constitutional:      Appearance: Normal appearance.  Eyes:     Extraocular Movements: Extraocular movements intact.  Cardiovascular:     Rate and Rhythm: Normal rate.     Pulses: Normal pulses.  Pulmonary:     Effort: Pulmonary effort is normal.  Musculoskeletal:     Comments: Right middle finger: Moderate swelling, erythema and tenderness to the dorsal distal phalanx with purulent fluid collection at the lateral edge of cuticle.   Skin:    General: Skin is warm and dry.  Neurological:     General: No focal deficit present.     Mental Status: He is alert and oriented to person, place, and time.            Assessment/Plan:   Steve Nelson was seen today for nail problem.  Diagnoses and all orders for this visit:  Paronychia of finger, right -     Incision and drainage -     cephALEXin (KEFLEX) 500 mg capsule; Take 1 capsule (500 mg total) by mouth 3 (three) times a  day for 7 days. -     ibuprofen (MOTRIN) tablet 600 mg     Assessment & Plan Steve Nelson is a 43 y.o. male is here for right middle finger pain and swelling distal phalanx with purulent appearing fluid collection along cuticle.   FROM joint, no evidence of tenosynovitis nor felon. Overall clinical picture most c/w severe paronychia with surrounding cellulitis. Successfully drained though swelling persists. Will treat with keflex and recommend continued dial soap soaks.   Patient will return for new or worsening symptoms. I discussed the findings today, diagnosis/differential diagnosis, plan and red flags that require return for reevaluation with PCP, Urgent care or EMERGENCY. Patient was agreeable to outlined plan and questions were answered, felt stable for discharge.    All pertinent previous records reviewed prior to and during visit.    PROCEDURE Performed digital block and incision/drainage. Incision and drainage  Date/Time: 04/12/2024 7:55 AM  Performed by: Steve Earnie Flank, NP Authorized by: Steve Earnie Flank, NP   Consent:    Consent obtained:  Verbal   Consent given by:  Patient   Risks, benefits, and alternatives were discussed: yes     Risks discussed:  Pain, bleeding and incomplete drainage   Alternatives discussed:  Observation Universal protocol:  Procedure explained and questions answered to patient or proxy's satisfaction: yes     Patient identity confirmed:  Verbally with patient Location:    Type:  Abscess (paronychia)   Size:  2cm Pre-procedure details:    Skin preparation:  Povidone-iodine Sedation:    Sedation type:  None Anesthesia:    Anesthesia method:  Nerve block   Block location:  Right middle finger   Block needle gauge:  25 G   Block anesthetic:  Lidocaine 1% w/o epi   Block injection procedure:  Introduced needle and anatomic landmarks identified   Block outcome:  Anesthesia achieved Procedure type:    Complexity:   Simple Procedure details:    Incision types:  Stab incision   Wound management:  Irrigated with saline   Drainage:  Purulent and bloody   Drainage amount:  Moderate   Wound treatment:  Wound left open Post-procedure details:    Procedure completion:  Tolerated well, no immediate complications   Home Care   Electronically signed by: Steve Earnie Flank, NP 04/12/2024 8:39 AM

## 2024-04-13 NOTE — Telephone Encounter (Signed)
 Will forward to provider

## 2024-05-06 ENCOUNTER — Encounter (INDEPENDENT_AMBULATORY_CARE_PROVIDER_SITE_OTHER): Payer: Self-pay | Admitting: Primary Care

## 2024-05-06 ENCOUNTER — Ambulatory Visit (INDEPENDENT_AMBULATORY_CARE_PROVIDER_SITE_OTHER): Admitting: Primary Care

## 2024-05-06 ENCOUNTER — Other Ambulatory Visit (INDEPENDENT_AMBULATORY_CARE_PROVIDER_SITE_OTHER): Payer: Self-pay

## 2024-05-06 VITALS — BP 136/93 | HR 92 | Resp 16 | Ht 66.0 in | Wt 230.4 lb

## 2024-05-06 DIAGNOSIS — E782 Mixed hyperlipidemia: Secondary | ICD-10-CM | POA: Diagnosis not present

## 2024-05-06 DIAGNOSIS — I10 Essential (primary) hypertension: Secondary | ICD-10-CM | POA: Diagnosis not present

## 2024-05-06 DIAGNOSIS — Z794 Long term (current) use of insulin: Secondary | ICD-10-CM | POA: Diagnosis not present

## 2024-05-06 DIAGNOSIS — E119 Type 2 diabetes mellitus without complications: Secondary | ICD-10-CM

## 2024-05-06 DIAGNOSIS — Z760 Encounter for issue of repeat prescription: Secondary | ICD-10-CM | POA: Diagnosis not present

## 2024-05-06 DIAGNOSIS — Z2821 Immunization not carried out because of patient refusal: Secondary | ICD-10-CM

## 2024-05-06 LAB — POCT GLYCOSYLATED HEMOGLOBIN (HGB A1C): HbA1c, POC (controlled diabetic range): 6.4 % (ref 0.0–7.0)

## 2024-05-06 MED ORDER — PEN NEEDLES 32G X 4 MM MISC: 6 refills | Status: AC

## 2024-05-06 MED ORDER — LANTUS SOLOSTAR 100 UNIT/ML ~~LOC~~ SOPN
10.0000 [IU] | PEN_INJECTOR | Freq: Every day | SUBCUTANEOUS | 1 refills | Status: AC
Start: 2024-05-06 — End: ?

## 2024-05-06 NOTE — Progress Notes (Signed)
 [facility]  Steve Nelson, is a 43 y.o. male  WJX:[id number]  [id number]  DOB - 11/23/1981  Chief Complaint  Patient presents with   Diabetes   Hypertension       Subjective:   Steve Nelson is a 43 y.o. male here today for a follow up visit management of HTN. Patient has No headache, No chest pain, No abdominal pain - No Nausea, No new weakness tingling or numbness, No Cough - shortness of breath. Type 2 diabetes- Denies polyuria, polydipsia, polyphasia or vision changes.  Does not check blood sugars at home.  Patient was supposed to be on the [place] but when he tried to download it it did not work.  Therefore he has not been using it.  Referred to [facility] clinical pharmacist for instructions and reorder  No problems updated.  Comprehensive ROS Pertinent positive and negative noted in HPI   No Known Allergies  Past Medical History:  Diagnosis Date   Chest pain    DM (diabetes mellitus) (HCC)    HLD (hyperlipidemia)    HTN (hypertension)    Hyperglycemia     Current Outpatient Medications on File Prior to Visit  Medication Sig Dispense Refill   Accu-Chek Softclix Lancets lancets Use to check blood sugar 3 times daily. 100 each 6   amLODipine  (NORVASC ) 10 MG tablet TAKE 1 TABLET(10 MG) BY MOUTH DAILY 90 tablet 1   aspirin  EC 81 MG tablet Take 1 tablet (81 mg total) by mouth daily. Swallow whole. 30 tablet 5   Blood Glucose Monitoring Suppl (ACCU-CHEK GUIDE) w/Device KIT Use to check blood sugar 3 times daily. 1 kit 0   cetirizine  (ZYRTEC ) 10 MG tablet Take 1 tablet (10 mg total) by mouth daily. 90 tablet 1   Continuous Glucose Receiver (FREESTYLE LIBRE 3 READER) DEVI Use to check blood sugar continuously. 1 each 0   Continuous Glucose Sensor (FREESTYLE LIBRE 3 SENSOR) MISC Place 1 sensor on the skin every 14 days. Use to check glucose continuously 2 each 6   Dulaglutide  (TRULICITY ) 1.5 MG/0.5ML SOAJ Inject 1.5 mg into the skin once a week. 2 mL 3    fluticasone  (FLONASE ) 50 MCG/ACT nasal spray Place 2 sprays into both nostrils daily. 16 g 2   glucose blood (ACCU-CHEK GUIDE TEST) test strip Use to check blood sugar 3 times daily. 100 each 6   ivabradine  (CORLANOR) 7.5 MG TABS tablet Take 2 tablets (15mg ) by mouth TWO hours prior to your cardiac CT scan. 2 tablet 0   JARDIANCE  10 MG TABS tablet TAKE 1 TABLET(10 MG) BY MOUTH DAILY 30 tablet 3   metFORMIN  (GLUCOPHAGE -XR) 500 MG 24 hr tablet Take 2 tablets (1,000 mg total) by mouth 2 (two) times daily with a meal. 360 tablet 1   metoprolol  succinate (TOPROL  XL) 25 MG 24 hr tablet Take 1 tablet (25 mg total) by mouth at bedtime. 90 tablet 3   pantoprazole  (PROTONIX ) 40 MG tablet Take 1 tablet (40 mg total) by mouth daily. 90 tablet 1   valsartan -hydrochlorothiazide  (DIOVAN -HCT) 160-25 MG tablet Take 1 tablet by mouth daily. 90 tablet 1   XIIDRA 5 % SOLN Apply 1 drop to eye 2 (two) times daily.     No current facility-administered medications on file prior to visit.   Health Maintenance  Topic Date Due   Complete foot exam   Never done   Eye exam for diabetics  Never done   HPV Vaccine (1 - Male 3-dose series) Never done  COVID-19 Vaccine (1 - 2024-25 season) Never done   DTaP/Tdap/Td vaccine (1 - Tdap) 09/14/2024*   Pneumococcal Vaccination (1 of 2 - PCV) 05/06/2025*   Flu Shot  06/25/2024   Yearly kidney health urinalysis for diabetes  11/05/2024   Hemoglobin A1C  11/05/2024   Yearly kidney function blood test for diabetes  05/06/2025   Hepatitis C Screening  Completed   HIV Screening  Completed   Meningitis B Vaccine  Aged Out  *Topic was postponed. The date shown is not the original due date.    Objective:  BP (!) 136/93   Pulse 92   Resp 16   Ht 5' 6 (1.676 m)   Wt 230 lb 6.4 oz (104.5 kg)   SpO2 95%   BMI 37.19 kg/m   BP Readings from Last 3 Encounters:  05/06/24 (!) 136/93  02/04/24 128/88  12/09/23 115/65      Physical Exam Vitals reviewed.  Constitutional:       Appearance: He is obese.  HENT:     Head: Normocephalic.     Right Ear: Tympanic membrane and external ear normal.     Left Ear: Tympanic membrane and external ear normal.     Nose: Nose normal.   Eyes:     Extraocular Movements: Extraocular movements intact.     Pupils: Pupils are equal, round, and reactive to light.    Cardiovascular:     Rate and Rhythm: Normal rate and regular rhythm.  Pulmonary:     Effort: Pulmonary effort is normal.     Breath sounds: Normal breath sounds.  Abdominal:     General: Bowel sounds are normal. There is distension.     Palpations: Abdomen is soft.   Musculoskeletal:        General: Normal range of motion.   Skin:    General: Skin is warm and dry.   Neurological:     Mental Status: He is oriented to person, place, and time.   Psychiatric:        Mood and Affect: Mood normal.        Behavior: Behavior normal.        Thought Content: Thought content normal.        Judgment: Judgment normal.       Assessment & Plan  Steve Nelson was seen today for diabetes and hypertension.  Diagnoses and all orders for this visit:  Type 2 diabetes mellitus without complication, without long-term current use of insulin  (HCC) - educated on lifestyle modifications, including but not limited to diet choices and adding exercise to daily routine.   -     POCT glycosylated hemoglobin (Hb A1C) 6.4 previously 9.8 -     CBC with Differential/Platelet -     Lipid panel  Medication refill -     insulin  glargine (LANTUS  SOLOSTAR) 100 UNIT/ML Solostar Pen; Inject 10 Units into the skin daily. -     Insulin  Pen Needle (PEN NEEDLES) 32G X 4 MM MISC; Use as instructed to inject insulin  once daily.  Essential hypertension BP goal - < 130/80 Explained that having normal blood pressure is the goal and medications are helping to get to goal and maintain normal blood pressure. DIET: Limit salt intake, read nutrition labels to check salt content, limit fried and high  fatty foods  Avoid using multisymptom OTC cold preparations that generally contain sudafed which can rise BP. Consult with pharmacist on best cold relief products to use for persons with HTN EXERCISE Discussed incorporating  exercise such as walking - 30 minutes most days of the week and can do in 10 minute intervals    -     CMP14+EGFR  Pneumococcal vaccination declined     Patient have been counseled extensively about nutrition and exercise. Other issues discussed during this visit include: low cholesterol diet, weight control and daily exercise, foot care, annual eye examinations at Ophthalmology, importance of adherence with medications and regular follow-up. We also discussed long term complications of uncontrolled diabetes and hypertension.   Return in about 3 months (around 08/06/2024) for DM/HTN.  The patient was given clear instructions to go to ER or return to medical center if symptoms don't improve, worsen or new problems develop. The patient verbalized understanding. The patient was told to call to get lab results if they haven't heard anything in the next week.   This note has been created with Education officer, environmental. Any transcriptional errors are unintentional.   Marius Siemens, NP 05/10/2024, 10:10 AM

## 2024-05-07 ENCOUNTER — Ambulatory Visit (INDEPENDENT_AMBULATORY_CARE_PROVIDER_SITE_OTHER): Payer: Self-pay | Admitting: Primary Care

## 2024-05-07 DIAGNOSIS — E782 Mixed hyperlipidemia: Secondary | ICD-10-CM

## 2024-05-07 LAB — CMP14+EGFR
ALT: 19 IU/L (ref 0–44)
AST: 19 IU/L (ref 0–40)
Albumin: 4.9 g/dL (ref 4.1–5.1)
Alkaline Phosphatase: 41 IU/L — ABNORMAL LOW (ref 44–121)
BUN/Creatinine Ratio: 13 (ref 9–20)
BUN: 15 mg/dL (ref 6–24)
Bilirubin Total: 0.6 mg/dL (ref 0.0–1.2)
CO2: 21 mmol/L (ref 20–29)
Calcium: 10.2 mg/dL (ref 8.7–10.2)
Chloride: 99 mmol/L (ref 96–106)
Creatinine, Ser: 1.12 mg/dL (ref 0.76–1.27)
Globulin, Total: 2.9 g/dL (ref 1.5–4.5)
Glucose: 77 mg/dL (ref 70–99)
Potassium: 4.1 mmol/L (ref 3.5–5.2)
Sodium: 138 mmol/L (ref 134–144)
Total Protein: 7.8 g/dL (ref 6.0–8.5)
eGFR: 84 mL/min/1.73

## 2024-05-07 LAB — CBC WITH DIFFERENTIAL/PLATELET
Basophils Absolute: 0.1 10*3/uL (ref 0.0–0.2)
Basos: 1 %
EOS (ABSOLUTE): 0 10*3/uL (ref 0.0–0.4)
Eos: 1 %
Hematocrit: 48.7 % (ref 37.5–51.0)
Hemoglobin: 15.3 g/dL (ref 13.0–17.7)
Immature Grans (Abs): 0 10*3/uL (ref 0.0–0.1)
Immature Granulocytes: 0 %
Lymphocytes Absolute: 1.7 10*3/uL (ref 0.7–3.1)
Lymphs: 30 %
MCH: 27.6 pg (ref 26.6–33.0)
MCHC: 31.4 g/dL — ABNORMAL LOW (ref 31.5–35.7)
MCV: 88 fL (ref 79–97)
Monocytes Absolute: 0.6 10*3/uL (ref 0.1–0.9)
Monocytes: 10 %
Neutrophils Absolute: 3.4 10*3/uL (ref 1.4–7.0)
Neutrophils: 58 %
Platelets: 318 10*3/uL (ref 150–450)
RBC: 5.55 x10E6/uL (ref 4.14–5.80)
RDW: 14.3 % (ref 11.6–15.4)
WBC: 5.8 10*3/uL (ref 3.4–10.8)

## 2024-05-07 LAB — LIPID PANEL
Chol/HDL Ratio: 3.1 ratio (ref 0.0–5.0)
Cholesterol, Total: 152 mg/dL (ref 100–199)
HDL: 49 mg/dL
LDL Chol Calc (NIH): 85 mg/dL (ref 0–99)
Triglycerides: 98 mg/dL (ref 0–149)
VLDL Cholesterol Cal: 18 mg/dL (ref 5–40)

## 2024-05-07 MED ORDER — ATORVASTATIN CALCIUM 20 MG PO TABS
20.0000 mg | ORAL_TABLET | Freq: Every day | ORAL | 3 refills | Status: AC
Start: 2024-05-07 — End: ?

## 2024-05-13 NOTE — Progress Notes (Deleted)
 Cardiology Office Note:   Date:  05/13/2024  ID:  Steve Nelson, DOB 16-Dec-1980, MRN [id number] PCP:  Marius Siemens, NP  [facility] Providers Cardiologist:  Alyssa Backbone, MD Referring MD: Marius Siemens, NP  Chief Complaint/Reason for Referral: ER follow-up chest pain ASSESSMENT:    1. Precordial pain   2. NICM (nonischemic cardiomyopathy) (HCC)   3. Type 2 diabetes mellitus with complication, without long-term current use of insulin  (HCC)   4. Hypertension associated with diabetes (HCC)   5. Hyperlipidemia associated with type 2 diabetes mellitus (HCC)   6. BMI 37.0-37.9, adult      PLAN:   In order of problems listed above: Chest pain: Reassuring evaluation with coronary CTA demonstrating no obstructive disease and no coronary calcification. Nonischemic cardiomyopathy: EF is normalized on Jardiance  10 mg and Toprol  25 mg; continue both Type 2 diabetes mellitus: Continue aspirin  81 mg, atorvastatin  20 mg, Jardiance  10 mg, valsartan /hydrochlorothiazide  160 x 25 mg daily Hypertension: Continue valsartan  by hydrochlorothiazide  160 x 25 mg daily, amlodipine  10 mg daily *** Hyperlipidemia: Check lipid panel, LFTs, and LP(a) today; continue atorvastatin  20 mg. Elevated BMI: Continue Trulicity  1.5 mg q. weekly             Dispo:  No follow-ups on file.      Medication Adjustments/Labs and Tests Ordered: Current medicines are reviewed at length with the patient today.  Concerns regarding medicines are outlined above.  The following changes have been made:     Labs/tests ordered: No orders of the defined types were placed in this encounter.   Medication Changes: No orders of the defined types were placed in this encounter.   Current medicines are reviewed at length with the patient today.  The patient does not have concerns regarding medicines.     History of Present Illness:      FOCUSED PROBLEM LIST:   Chest pain No plaque, CAC 0, coronary CTA  2025 Nonischemic cardiomyopathy EF 40 to 45% TTE January 2025 EF 60 to 65% TTE March 2025 Type 2 diabetes mellitus On metformin  HbA1C 06 Nov 2023 >> starting insulin  Hypertension Hyperlipidemia BMI 35 On Trulicity  GERD Palpitations Rare PACs and PVCs monitor 2024 (Care Everywhere)   12/24: The patient is a 43 year old male with the above listed medical problems here for emergency room follow-up for chest pain.  The patient presented in October to [facility] with chest pain and dizziness.  The chest pain was reportedly random and sometimes occurred when laying down or after eating.  He was noted to be out of his hypertensive medication and his blood pressure was 137/98.  Head CT and chest x-ray were reassuring.  Troponins and D-dimer were also reassuring.  His blood work was significant for an elevated blood glucose level.  He was ultimately discharged home.  He then presented in December with failure to thrive.  His blood glucose was found to be quite elevated and his hemoglobin A1c was 12.  He was ultimately discharged home.  He saw his PCP recently and was started on Ozempic  and he will be starting insulin  soon.  He tells me that he does develop chest pain at times.  It can happen at rest and sometimes with exertion.  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  He occasionally gets dizzy and has palpitations along with this.  He is unsure whether this is related to his blood sugars or not.  He fortunately has  not passed out.  He has not had any issues with his rosuvastatin , Diovan /HCT, or amlodipine .  He denies any peripheral edema or paroxysmal nocturnal dyspnea.  He fortunately has quit smoking.  Plans: Coronary CTA, echocardiogram, start aspirin , check reflex TSH, CBC, and CMP; obtain monitor  June 2025:  Patient consents to use of AI scribe. In the interim the patient had no labs drawn.  His coronary CTA was unremarkable with no obstructive disease  and no coronary artery calcification.  Echocardiogram demonstrated moderate cardiomyopathy with ejection fraction of around 40%.  The patient was started on Toprol  25 mg and Jardiance  10 mg; repeat echocardiogram demonstrated normalization of LV function.           Current Medications: No outpatient medications have been marked as taking for the 05/17/24 encounter (Appointment) with [place] Mckeough K, MD.     Review of Systems:   Please see the history of present illness.    All other systems reviewed and are negative.     EKGs/Labs/Other Test Reviewed:   EKG: EKG from December 2024 demonstrates sinus tachycardia with borderline ST changes laterally  EKG Interpretation Date/Time:    Ventricular Rate:    PR Interval:    QRS Duration:    QT Interval:    QTC Calculation:   R Axis:      Text Interpretation:           Risk Assessment/Calculations:          Physical Exam:   VS:  There were no vitals taken for this visit.   No BP recorded.  {Refresh Note OR Click here to enter BP  :1}***   Wt Readings from Last 3 Encounters:  05/06/24 230 lb 6.4 oz (104.5 kg)  11/07/23 216 lb 9.6 oz (98.2 kg)  11/06/23 220 lb (99.8 kg)      GENERAL:  No apparent distress, AOx3 HEENT:  No carotid bruits, +2 carotid impulses, no scleral icterus CAR: Tachycardic no murmurs, gallops, rubs, or thrills RES:  Clear to auscultation bilaterally ABD:  Soft, nontender, nondistended, positive bowel sounds x 4 VASC:  +2 radial pulses, +2 carotid pulses NEURO:  CN 2-12 grossly intact; motor and sensory grossly intact PSYCH:  No active depression or anxiety EXT:  No edema, ecchymosis, or cyanosis  Signed, Damian Buckles K Raheem Kolbe, MD  05/13/2024 1:23 PM    [facility] [address] [place], [place], [place]  [address] Phone: [phone]; Fax: [phone]   Note:  This document was prepared using Dragon voice recognition software and may include unintentional dictation errors.

## 2024-05-17 ENCOUNTER — Ambulatory Visit: Payer: Medicaid Other | Admitting: Internal Medicine

## 2024-05-17 DIAGNOSIS — E118 Type 2 diabetes mellitus with unspecified complications: Secondary | ICD-10-CM

## 2024-05-17 DIAGNOSIS — Z6837 Body mass index (BMI) 37.0-37.9, adult: Secondary | ICD-10-CM

## 2024-05-17 DIAGNOSIS — R072 Precordial pain: Secondary | ICD-10-CM

## 2024-05-17 DIAGNOSIS — E1169 Type 2 diabetes mellitus with other specified complication: Secondary | ICD-10-CM

## 2024-05-17 DIAGNOSIS — E1159 Type 2 diabetes mellitus with other circulatory complications: Secondary | ICD-10-CM

## 2024-05-17 DIAGNOSIS — I428 Other cardiomyopathies: Secondary | ICD-10-CM

## 2024-05-31 ENCOUNTER — Ambulatory Visit: Admitting: Pharmacist

## 2024-05-31 NOTE — Progress Notes (Unsigned)
 Cardiology Office Note    Patient Name: Steve Nelson Date of Encounter: 05/31/2024  Primary Care Provider:  Celestia Rosaline SQUIBB, NP Primary Cardiologist:  None Primary Electrophysiologist: None   Past Medical History    Past Medical History:  Diagnosis Date   Chest pain    DM (diabetes mellitus) (HCC)    HLD (hyperlipidemia)    HTN (hypertension)    Hyperglycemia     History of Present Illness  Steve Nelson is a 43 y.o. male with a PMH of NICM, DM type II, HTN, HLD obesity, GERD, palpitations who presents today for 15-month follow-up.  Steve Nelson was seen initially by Dr. Wendel in December 2024 for post ED follow-up of chest pain.  He endorsed pain that occurred randomly and sometimes while lying down.  He had a troponin and D-dimer that were normal with elevated blood sugar noted.  He was discharged and seen in follow-up on 11/07/2023.  During visit he endorsed occasional episodes of chest pain with associated palpitations.  He was also seen by his PCP and started on Ozempic  and insulin .  He was advised to complete a coronary CT that showed calcium  score of 0 with normal coronaries.  2D echo was also completed that showed reduced EF of 40-45% mild left LVH and grade 1 DD with trivial MVR.  He was started on Toprol  25 mg and Jardiance  10 mg.  2D echo was repeated 3 months later that showed EF of 60 to 65%.  Steve Nelson presents today for 67-month follow-up.  He reports since his previous visit doing well and denies any chest pain or new complaints of angina.He is currently taking Norvasc , Lipitor, Jardiance , Toprol , and Diovan . He also takes pantoprazole  for acid reflux, which has been helpful. He requires refills for Norvasc , Lipitor, Jardiance , and Toprol . No current chest pain or heart pain. He has concerns about his vision and the need for an eye test, which he attributes to his diabetes. He has not been to an eye doctor recently due to insurance coverage issues. 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 He wants to be more active but cites the hot weather as a barrier. He reports a past incident of foot pain and swelling after injuring his toenail, which required urgent care intervention. No smoking. Drinks socially. Drinks a lot of water and does not consume caffeine. He questions if his current medications might be affecting his heart rate, which was noted to be elevated at 109 bpm during the visit.   Discussed the use of AI scribe software for clinical note transcription with the patient, who gave verbal consent to proceed.  History of Present Illness   Review of Systems  Please see the history of present illness.    All other systems reviewed and are otherwise negative except as noted above.  Physical Exam    Wt Readings from Last 3 Encounters:  05/06/24 230 lb 6.4 oz (104.5 kg)  11/07/23 216 lb 9.6 oz (98.2 kg)  11/06/23 220 lb (99.8 kg)   CD:Uyzmz were no vitals filed for this visit.,There is no height or weight on file to calculate BMI. GEN: Well nourished, well developed in no acute distress Neck: No JVD; No carotid bruits Pulmonary: Clear to auscultation without rales, wheezing or rhonchi  Cardiovascular: Normal rate. Regular rhythm. Normal S1. Normal S2.   Murmurs: There is no murmur.  ABDOMEN: Soft, non-tender, non-distended EXTREMITIES:  No edema; No deformity   EKG/LABS/ Recent Cardiac Studies   ECG personally reviewed by me today -not completed today  Risk Assessment/Calculations:          Lab Results  Component Value Date   WBC 5.8 05/06/2024   HGB 15.3 05/06/2024   HCT 48.7 05/06/2024   MCV 88 05/06/2024   PLT 318 05/06/2024   Lab Results  Component Value Date   CREATININE 1.12 05/06/2024   BUN 15 05/06/2024   NA 138 05/06/2024   K 4.1 05/06/2024   CL 99 05/06/2024   CO2 21 05/06/2024   Lab Results  Component Value Date   CHOL 152  05/06/2024   HDL 49 05/06/2024   LDLCALC 85 05/06/2024   TRIG 98 05/06/2024   CHOLHDL 3.1 05/06/2024    Lab Results  Component Value Date   HGBA1C 6.4 05/06/2024   Assessment & Plan    Assessment & Plan   1.HFimEF/NICM: - Patient's previous ejection fraction was 40-45% and is currently 60-65% - Patient is euvolemic on examination denies any shortness of breath with exertion. - Continue Jardiance  10 mg, Toprol -XL 25 mg twice daily, Diovan  160-25 mg -Low sodium diet, fluid restriction <2L, and daily weights encouraged. Educated to contact our office for weight gain of 2 lbs overnight or 5 lbs in one week.   2.  Essential hypertension: - Patient's blood pressure today is stable at 122/70 - Continue Diovan -HCTZ 160-25 mg, Toprol -XL 25 mg twice daily, Norvasc  10 mg daily  3.  Hyperlipidemia -Cholesterol levels improved with medication. LDL decreased, reducing cardiovascular risk. - Continue Lipitor. - Monitor cholesterol levels regularly.  4.  IDDM type II -Diabetes managed with Jardiance . Emphasized regular exercise for cardiovascular health and diabetes management. Advised annual eye exams and foot care. - Continue Jardiance . - Encourage regular exercise and physical activity. - Advise annual eye exams and proper foot care.  5. Elevated heart rate: Heart rate at 109 bpm. Possible causes include medication effects or stress. Discussed Zyrtec 's potential impact. - Increase metoprolol  to 50 mg daily, split into 25 mg in the morning and 25 mg in the evening. - Consider holding Zyrtec  to assess impact on heart rate.  Disposition: Follow-up with None or APP in 6 months    Signed, Wyn Raddle, Jackee Shove, NP 05/31/2024, 10:40 AM [facility]

## 2024-06-01 ENCOUNTER — Encounter: Payer: Self-pay | Admitting: Nurse Practitioner

## 2024-06-01 ENCOUNTER — Ambulatory Visit: Attending: Nurse Practitioner | Admitting: Nurse Practitioner

## 2024-06-01 VITALS — BP 122/70 | HR 109 | Ht 66.0 in | Wt 234.0 lb

## 2024-06-01 DIAGNOSIS — I10 Essential (primary) hypertension: Secondary | ICD-10-CM | POA: Diagnosis not present

## 2024-06-01 DIAGNOSIS — I428 Other cardiomyopathies: Secondary | ICD-10-CM | POA: Insufficient documentation

## 2024-06-01 DIAGNOSIS — I5032 Chronic diastolic (congestive) heart failure: Secondary | ICD-10-CM | POA: Diagnosis not present

## 2024-06-01 DIAGNOSIS — E1159 Type 2 diabetes mellitus with other circulatory complications: Secondary | ICD-10-CM | POA: Insufficient documentation

## 2024-06-01 DIAGNOSIS — E785 Hyperlipidemia, unspecified: Secondary | ICD-10-CM | POA: Diagnosis not present

## 2024-06-01 DIAGNOSIS — E1169 Type 2 diabetes mellitus with other specified complication: Secondary | ICD-10-CM | POA: Insufficient documentation

## 2024-06-01 DIAGNOSIS — I152 Hypertension secondary to endocrine disorders: Secondary | ICD-10-CM | POA: Diagnosis not present

## 2024-06-01 DIAGNOSIS — E119 Type 2 diabetes mellitus without complications: Secondary | ICD-10-CM | POA: Diagnosis not present

## 2024-06-01 MED ORDER — PANTOPRAZOLE SODIUM 40 MG PO TBEC: 40.0000 mg | DELAYED_RELEASE_TABLET | Freq: Every day | ORAL | 1 refills | Status: DC

## 2024-06-01 MED ORDER — AMLODIPINE BESYLATE 10 MG PO TABS: 10.0000 mg | ORAL_TABLET | Freq: Every day | ORAL | 1 refills | Status: DC

## 2024-06-01 MED ORDER — EMPAGLIFLOZIN 10 MG PO TABS: 10.0000 mg | ORAL_TABLET | Freq: Every day | ORAL | 1 refills | Status: DC

## 2024-06-01 MED ORDER — ASPIRIN 81 MG PO TBEC: 81.0000 mg | DELAYED_RELEASE_TABLET | Freq: Every day | ORAL | 1 refills | Status: DC

## 2024-06-01 MED ORDER — METOPROLOL SUCCINATE ER 25 MG PO TB24: 25.0000 mg | ORAL_TABLET | Freq: Two times a day (BID) | ORAL | 1 refills | Status: DC

## 2024-06-01 NOTE — Patient Instructions (Signed)
 Medication Instructions:  INCREASE Metoprolol  to 25mg  Take 1 tablet twice a day  *If you need a refill on your cardiac medications before your next appointment, please call your pharmacy*  Lab Work: None ordered If you have labs (blood work) drawn today and your tests are completely normal, you will receive your results only by: MyChart Message (if you have MyChart) OR A paper copy in the mail If you have any lab test that is abnormal or we need to change your treatment, we will call you to review the results.  Testing/Procedures: None ordered  Follow-Up: At [facility], you and your health needs are our priority.  As part of our continuing mission to provide you with exceptional heart care, our providers are all part of one team.  This team includes your primary Cardiologist (physician) and Advanced Practice Providers or APPs (Physician Assistants and Nurse Practitioners) who all work together to provide you with the care you need, when you need it.  Your next appointment:   6 month(s)  Provider:   Jackee Alberts, NP        We recommend signing up for the patient portal called MyChart.  Sign up information is provided on this After Visit Summary.  MyChart is used to connect with patients for Virtual Visits (Telemedicine).  Patients are able to view lab/test results, encounter notes, upcoming appointments, etc.  Non-urgent messages can be sent to your provider as well.   To learn more about what you can do with MyChart, go to [email or web address].   Other Instructions

## 2024-06-03 ENCOUNTER — Ambulatory Visit: Payer: Self-pay | Admitting: Pharmacist

## 2024-06-11 ENCOUNTER — Telehealth (INDEPENDENT_AMBULATORY_CARE_PROVIDER_SITE_OTHER): Payer: Self-pay | Admitting: Primary Care

## 2024-06-11 NOTE — Telephone Encounter (Signed)
 Called pt to confirm appt. Pt will be present.

## 2024-06-14 ENCOUNTER — Encounter (INDEPENDENT_AMBULATORY_CARE_PROVIDER_SITE_OTHER): Payer: Self-pay | Admitting: Primary Care

## 2024-06-14 ENCOUNTER — Ambulatory Visit (INDEPENDENT_AMBULATORY_CARE_PROVIDER_SITE_OTHER): Admitting: Primary Care

## 2024-06-14 VITALS — BP 152/98 | HR 84 | Resp 16 | Wt 240.8 lb

## 2024-06-14 DIAGNOSIS — R079 Chest pain, unspecified: Secondary | ICD-10-CM | POA: Diagnosis not present

## 2024-06-14 DIAGNOSIS — I10 Essential (primary) hypertension: Secondary | ICD-10-CM | POA: Diagnosis not present

## 2024-06-14 NOTE — Progress Notes (Signed)
 [facility]   Steve Nelson is a 43 y.o. male presents for chest pain evaluation no radiating factors.  Blood pressure is elevated today denies shortness of breath, headaches, r lower extremity edema, sudden onset, vision changes, unilateral weakness, dizziness, paresthesias   Patient reports adherence with medications.   Past Medical History:  Diagnosis Date   Chest pain    DM (diabetes mellitus) (HCC)    HLD (hyperlipidemia)    HTN (hypertension)    Hyperglycemia    No past surgical history on file. No Known Allergies Current Outpatient Medications on File Prior to Visit  Medication Sig Dispense Refill   Accu-Chek Softclix Lancets lancets Use to check blood sugar 3 times daily. 100 each 6   amLODipine  (NORVASC ) 10 MG tablet Take 1 tablet (10 mg total) by mouth daily. 90 tablet 1   aspirin  EC 81 MG tablet Take 1 tablet (81 mg total) by mouth daily. Swallow whole. 90 tablet 1   atorvastatin  (LIPITOR) 20 MG tablet Take 1 tablet (20 mg total) by mouth daily. 90 tablet 3   Blood Glucose Monitoring Suppl (ACCU-CHEK GUIDE) w/Device KIT Use to check blood sugar 3 times daily. 1 kit 0   cetirizine  (ZYRTEC ) 10 MG tablet Take 1 tablet (10 mg total) by mouth daily. 90 tablet 1   Continuous Glucose Receiver (FREESTYLE LIBRE 3 READER) DEVI Use to check blood sugar continuously. 1 each 0   Continuous Glucose Sensor (FREESTYLE LIBRE 3 SENSOR) MISC Place 1 sensor on the skin every 14 days. Use to check glucose continuously 2 each 6   Dulaglutide  (TRULICITY ) 1.5 MG/0.5ML SOAJ Inject 1.5 mg into the skin once a week. 2 mL 3   empagliflozin  (JARDIANCE ) 10 MG TABS tablet Take 1 tablet (10 mg total) by mouth daily. 90 tablet 1   fluticasone  (FLONASE ) 50 MCG/ACT nasal spray Place 2 sprays into both nostrils daily. 16 g 2   glucose blood (ACCU-CHEK GUIDE TEST) test strip Use to check blood sugar 3 times daily. 100 each 6   insulin  glargine (LANTUS  SOLOSTAR) 100 UNIT/ML Solostar Pen  Inject 10 Units into the skin daily. 15 mL 1   Insulin  Pen Needle (PEN NEEDLES) 32G X 4 MM MISC Use as instructed to inject insulin  once daily. 100 each 6   metFORMIN  (GLUCOPHAGE -XR) 500 MG 24 hr tablet Take 2 tablets (1,000 mg total) by mouth 2 (two) times daily with a meal. 360 tablet 1   metoprolol  succinate (TOPROL  XL) 25 MG 24 hr tablet Take 1 tablet (25 mg total) by mouth in the morning and at bedtime. 180 tablet 1   pantoprazole  (PROTONIX ) 40 MG tablet Take 1 tablet (40 mg total) by mouth daily. 90 tablet 1   valsartan -hydrochlorothiazide  (DIOVAN -HCT) 160-25 MG tablet Take 1 tablet by mouth daily. 90 tablet 1   XIIDRA 5 % SOLN Apply 1 drop to eye 2 (two) times daily. (Patient not taking: Reported on 06/01/2024)     No current facility-administered medications on file prior to visit.   Social History   Socioeconomic History   Marital status: Single    Spouse name: Not on file   Number of children: Not on file   Years of education: Not on file   Highest education level: 12th grade  Occupational History   Not on file  Tobacco Use   Smoking status: Former    Current packs/day: 0.00    Types: Cigarettes    Quit date: 07/23/2023    Years since quitting: 0.8  Smokeless tobacco: Never  Vaping Use   Vaping status: Never Used  Substance and Sexual Activity   Alcohol use: Not Currently   Drug use: Never   Sexual activity: Yes    Partners: Female    Birth control/protection: None  Other Topics Concern   Not on file  Social History Narrative   Not on file   Social Drivers of Health   Financial Resource Strain: Low Risk  (11/10/2023)   Overall Financial Resource Strain (CARDIA)    Difficulty of Paying Living Expenses: Not hard at all  Recent Concern: Financial Resource Strain - Medium Risk (08/27/2023)   Overall Financial Resource Strain (CARDIA)    Difficulty of Paying Living Expenses: Somewhat hard  Food Insecurity: No Food Insecurity (06/14/2024)   Hunger Vital Sign     Worried About Running Out of Food in the Last Year: Never true    Ran Out of Food in the Last Year: Never true  Transportation Needs: No Transportation Needs (06/14/2024)   PRAPARE - Administrator, Civil Service (Medical): No    Lack of Transportation (Non-Medical): No  Physical Activity: Inactive (11/10/2023)   Exercise Vital Sign    Days of Exercise per Week: 0 days    Minutes of Exercise per Session: 0 min  Stress: No Stress Concern Present (11/10/2023)   [employer] of Occupational Health - Occupational Stress Questionnaire    Feeling of Stress : Not at all  Social Connections: Socially Isolated (11/10/2023)   Social Connection and Isolation Panel    Frequency of Communication with Friends and Family: Once a week    Frequency of Social Gatherings with Friends and Family: Once a week    Attends Religious Services: Never    Database administrator or Organizations: No    Attends Engineer, structural: Not on file    Marital Status: Never married  Intimate Partner Violence: Not At Risk (06/14/2024)   Humiliation, Afraid, Rape, and Kick questionnaire    Fear of Current or Ex-Partner: No    Emotionally Abused: No    Physically Abused: No    Sexually Abused: No   Family History  Problem Relation Age of Onset   Hypertension Mother    Diabetes Mother    Health Maintenance  Topic Date Due   FOOT EXAM  Never done   OPHTHALMOLOGY EXAM  Never done   Hepatitis B Vaccines (1 of 3 - 19+ 3-dose series) Never done   HPV VACCINES (1 - 3-dose SCDM series) Never done   COVID-19 Vaccine (1 - 2024-25 season) Never done   DTaP/Tdap/Td (1 - Tdap) 09/14/2024 (Originally 06/06/1999)   Pneumococcal Vaccine 12-37 Years old (1 of 2 - PCV) 05/06/2025 (Originally 06/06/1999)   INFLUENZA VACCINE  06/25/2024   Diabetic kidney evaluation - Urine ACR  11/05/2024   HEMOGLOBIN A1C  11/05/2024   Diabetic kidney evaluation - eGFR measurement  05/06/2025   Hepatitis C Screening   Completed   HIV Screening  Completed   Meningococcal B Vaccine  Aged Out     OBJECTIVE:  Vitals:   06/14/24 1351 06/14/24 1352  BP: (!) 155/121 (!) 152/106  Pulse: 84   Resp: 16   SpO2: 98%   Weight: 240 lb 12.8 oz (109.2 kg)     Physical Exam Vitals reviewed.  Constitutional:      Appearance: He is obese.  HENT:     Head: Normocephalic.     Right Ear: External ear normal.  Left Ear: External ear normal.     Nose: Nose normal.  Eyes:     Extraocular Movements: Extraocular movements intact.  Cardiovascular:     Rate and Rhythm: Normal rate and regular rhythm.  Pulmonary:     Effort: Pulmonary effort is normal.     Breath sounds: Normal breath sounds.  Abdominal:     General: Bowel sounds are normal.     Palpations: Abdomen is soft.  Musculoskeletal:        General: Normal range of motion.     Cervical back: Normal range of motion and neck supple.  Skin:    General: Skin is warm and dry.  Neurological:     Mental Status: He is oriented to person, place, and time.  Psychiatric:        Mood and Affect: Mood normal.        Behavior: Behavior normal.        Thought Content: Thought content normal.        Judgment: Judgment normal.      ROS  Last 3 Office BP readings: BP Readings from Last 3 Encounters:  06/14/24 (!) 152/106  06/01/24 122/70  05/06/24 (!) 136/93    BMET    Component Value Date/Time   NA 138 05/06/2024 1409   K 4.1 05/06/2024 1409   CL 99 05/06/2024 1409   CO2 21 05/06/2024 1409   GLUCOSE 77 05/06/2024 1409   GLUCOSE 466 (H) 10/27/2023 1418   BUN 15 05/06/2024 1409   CREATININE 1.12 05/06/2024 1409   CALCIUM  10.2 05/06/2024 1409   GFRNONAA >60 10/27/2023 1418   GFRAA 116 10/18/2019 1054    Renal function: CrCl cannot be calculated (Patient's most recent lab result is older than the maximum 21 days allowed.).  Clinical ASCVD: Yes  The 10-year ASCVD risk score (Arnett DK, et al., 2019) is: 15.1%   Values used to calculate the  score:     Age: 18 years     Clincally relevant sex: Male     Is Non-Hispanic African American: Yes     Diabetic: Yes     Tobacco smoker: No     Systolic Blood Pressure: 152 mmHg     Is BP treated: Yes     HDL Cholesterol: 49 mg/dL     Total Cholesterol: 152 mg/dL  ASCVD risk factors include- [place]   ASSESSMENT & PLAN: Cosby was seen today for hypertension and diabetes.  Diagnoses and all orders for this visit:  Chest pain, unspecified type Followed by cardiology 2D echo was also completed that showed reduced EF of 40-45% mild left LVH and grade 1 DD with trivial MVR .  Medications adjusted.  Discussed signs and symptoms of a MI Asprin chew and call 911  Essential hypertension Elevated today recently seeing cardiologist on 06/01/2024 adjusted his medications.  He does admit that he did not take his medications yesterday but did today. -Counseled on lifestyle modifications for blood pressure control including reduced dietary sodium, increased exercise, weight reduction and adequate sleep. Also, educated patient about the risk for cardiovascular events, stroke and heart attack. Also counseled patient about the importance of medication adherence. If you participate in smoking, it is important to stop using tobacco as this will increase the risks associated with uncontrolled blood pressure.   -   This note has been created with Education officer, environmental. Any transcriptional errors are unintentional.   Rosaline SHAUNNA Bohr, NP 06/14/2024, 2:01 PM

## 2024-09-10 ENCOUNTER — Other Ambulatory Visit: Payer: Self-pay | Admitting: Nurse Practitioner

## 2024-09-10 DIAGNOSIS — I10 Essential (primary) hypertension: Secondary | ICD-10-CM

## 2024-09-13 ENCOUNTER — Other Ambulatory Visit: Payer: Self-pay | Admitting: Family Medicine

## 2024-09-13 DIAGNOSIS — E119 Type 2 diabetes mellitus without complications: Secondary | ICD-10-CM

## 2024-09-13 DIAGNOSIS — Z760 Encounter for issue of repeat prescription: Secondary | ICD-10-CM

## 2024-09-13 DIAGNOSIS — Z1159 Encounter for screening for other viral diseases: Secondary | ICD-10-CM

## 2024-09-17 ENCOUNTER — Other Ambulatory Visit: Payer: Self-pay | Admitting: Family Medicine

## 2024-09-17 DIAGNOSIS — E119 Type 2 diabetes mellitus without complications: Secondary | ICD-10-CM

## 2024-09-17 DIAGNOSIS — Z760 Encounter for issue of repeat prescription: Secondary | ICD-10-CM

## 2024-09-18 NOTE — Telephone Encounter (Signed)
 Duplicate request.  Requested Prescriptions  Pending Prescriptions Disp Refills   metFORMIN  (GLUCOPHAGE -XR) 500 MG 24 hr tablet [Pharmacy Med Name: METFORMIN  ER 500MG  24HR TABS] 360 tablet 0    Sig: TAKE 2 TABLETS(1000 MG) BY MOUTH TWICE DAILY WITH A MEAL     Endocrinology:  Diabetes - Biguanides Failed - 09/18/2024 10:56 AM      Failed - B12 Level in normal range and within 720 days    No results found for: VITAMINB12       Passed - Cr in normal range and within 360 days    Creatinine, Ser  Date Value Ref Range Status  05/06/2024 1.12 0.76 - 1.27 mg/dL Final         Passed - HBA1C is between 0 and 7.9 and within 180 days    HbA1c, POC (controlled diabetic range)  Date Value Ref Range Status  05/06/2024 6.4 0.0 - 7.0 % Final         Passed - eGFR in normal range and within 360 days    GFR calc Af Amer  Date Value Ref Range Status  10/18/2019 116 >59 mL/min/1.73 Final   GFR, Estimated  Date Value Ref Range Status  10/27/2023 >60 >60 mL/min Final    Comment:    (NOTE) Calculated using the CKD-EPI Creatinine Equation (2021)    eGFR  Date Value Ref Range Status  05/06/2024 84 >59 mL/min/1.73 Final         Passed - Valid encounter within last 6 months    Recent Outpatient Visits           3 months ago Chest pain, unspecified type   [place] [facility] Celestia Rosaline SQUIBB, NP   4 months ago Type 2 diabetes mellitus without complication, without long-term current use of insulin  (HCC)   [place] [facility] Celestia Rosaline SQUIBB, NP   5 months ago Type 2 diabetes mellitus without complication, with long-term current use of insulin  (HCC)   [facility] [place] Fleeta Tonia Senior L, RPH-CPP   6 months ago Need for hepatitis C screening test   [facility] Fleeta Tonia Senior L, RPH-CPP   7 months ago Type 2 diabetes  mellitus without complication, without long-term current use of insulin  [facility])   [place] [facility] Celestia Rosaline SQUIBB, NP       Future Appointments             In 1 month [place], Lum CROME, NP [facility] [employer] [facility] [employer]. [facility], H&[facility]            Passed - CBC within normal limits and completed in the last 12 months    WBC  Date Value Ref Range Status  05/06/2024 5.8 3.4 - 10.8 x10E3/uL Final  10/27/2023 7.4 4.0 - 10.5 K/uL Final   RBC  Date Value Ref Range Status  05/06/2024 5.55 4.14 - 5.80 x10E6/uL Final  10/27/2023 5.25 4.22 - 5.81 MIL/uL Final   Hemoglobin  Date Value Ref Range Status  05/06/2024 15.3 13.0 - 17.7 g/dL Final   Hematocrit  Date Value Ref Range Status  05/06/2024 48.7 37.5 - 51.0 % Final   MCHC  Date Value Ref Range Status  05/06/2024 31.4 (L) 31.5 - 35.7 g/dL Final  87/97/7975 65.1 30.0 - 36.0 g/dL  Final   MCH  Date Value Ref Range Status  05/06/2024 27.6 26.6 - 33.0 pg Final  10/27/2023 29.1 26.0 - 34.0 pg Final   MCV  Date Value Ref Range Status  05/06/2024 88 79 - 97 fL Final   No results found for: PLTCOUNTKUC, LABPLAT, POCPLA RDW  Date Value Ref Range Status  05/06/2024 14.3 11.6 - 15.4 % Final

## 2024-10-01 DIAGNOSIS — B029 Zoster without complications: Secondary | ICD-10-CM | POA: Diagnosis not present

## 2024-10-28 ENCOUNTER — Encounter (INDEPENDENT_AMBULATORY_CARE_PROVIDER_SITE_OTHER): Admitting: Primary Care

## 2024-11-04 ENCOUNTER — Other Ambulatory Visit: Payer: Self-pay | Admitting: Family Medicine

## 2024-11-04 ENCOUNTER — Other Ambulatory Visit: Payer: Self-pay | Admitting: Nurse Practitioner

## 2024-11-04 DIAGNOSIS — Z1159 Encounter for screening for other viral diseases: Secondary | ICD-10-CM

## 2024-11-05 ENCOUNTER — Encounter (INDEPENDENT_AMBULATORY_CARE_PROVIDER_SITE_OTHER): Payer: Self-pay | Admitting: Primary Care

## 2024-11-08 NOTE — Telephone Encounter (Signed)
 Pt has scheduled a 6 month f/u with michelle pt can keep that appt but he will need to come in this month for labs. I an unable to refill cholesterol medication until labs are done  Please schedule a lab appt

## 2024-11-16 ENCOUNTER — Ambulatory Visit: Admitting: Nurse Practitioner

## 2024-11-16 ENCOUNTER — Ambulatory Visit: Admitting: Emergency Medicine

## 2024-12-10 ENCOUNTER — Telehealth (INDEPENDENT_AMBULATORY_CARE_PROVIDER_SITE_OTHER): Payer: Self-pay | Admitting: Primary Care

## 2024-12-10 NOTE — Telephone Encounter (Signed)
 Left VM with pt about their upcoming appt. Pt did not answer

## 2024-12-11 ENCOUNTER — Other Ambulatory Visit: Payer: Self-pay | Admitting: Nurse Practitioner

## 2024-12-13 ENCOUNTER — Ambulatory Visit (INDEPENDENT_AMBULATORY_CARE_PROVIDER_SITE_OTHER): Admitting: Primary Care

## 2024-12-13 ENCOUNTER — Encounter (INDEPENDENT_AMBULATORY_CARE_PROVIDER_SITE_OTHER): Payer: Self-pay | Admitting: Primary Care

## 2024-12-13 VITALS — BP 150/88 | HR 85 | Resp 16 | Ht 66.0 in | Wt 250.4 lb

## 2024-12-13 DIAGNOSIS — E119 Type 2 diabetes mellitus without complications: Secondary | ICD-10-CM

## 2024-12-13 DIAGNOSIS — R051 Acute cough: Secondary | ICD-10-CM

## 2024-12-13 DIAGNOSIS — I10 Essential (primary) hypertension: Secondary | ICD-10-CM

## 2024-12-13 DIAGNOSIS — J9801 Acute bronchospasm: Secondary | ICD-10-CM | POA: Diagnosis not present

## 2024-12-13 DIAGNOSIS — G4733 Obstructive sleep apnea (adult) (pediatric): Secondary | ICD-10-CM | POA: Diagnosis not present

## 2024-12-13 DIAGNOSIS — E782 Mixed hyperlipidemia: Secondary | ICD-10-CM | POA: Diagnosis not present

## 2024-12-13 DIAGNOSIS — Z1159 Encounter for screening for other viral diseases: Secondary | ICD-10-CM

## 2024-12-13 MED ORDER — DM-GUAIFENESIN ER 30-600 MG PO TB12: 1.0000 | ORAL_TABLET | Freq: Two times a day (BID) | ORAL | 1 refills | Status: AC | PRN

## 2024-12-13 MED ORDER — ALBUTEROL SULFATE HFA 108 (90 BASE) MCG/ACT IN AERS: 2.0000 | INHALATION_SPRAY | Freq: Four times a day (QID) | RESPIRATORY_TRACT | 2 refills | Status: AC | PRN

## 2024-12-13 NOTE — Progress Notes (Signed)
 " [facility]  Steve Nelson, is a 44 y.o. male  RDW:[id number]  FMW:[id number]  DOB - 08/06/1981  Chief Complaint  Patient presents with   Sinus Problem   Diabetes   Referral    For sleep study   Pt states he snores badly        Subjective:   Steve Nelson is a 44 y.o. male here today for an acute visit.  Sinus Problem This is a recurrent problem. The current episode started in the past 7 days. The problem has been gradually worsening since onset. There has been no fever. His pain is at a severity of 6/10. The pain is moderate. Associated symptoms include congestion, coughing, diaphoresis, headaches, a hoarse voice, shortness of breath, sinus pressure and a sore throat. Past treatments include acetaminophen and oral decongestants. The treatment provided no relief.  Patient presents with possible obstructive sleep apnea. Patent has a 2 years history of symptoms of daytime fatigue, morning fatigue, and hypertension. Patient generally gets 4 or 5 hours of sleep per night, and states they generally have nightime awakenings. Snoring of severe severity is present. Apneic episodes is present. Nasal obstruction is not present.  Patient has not had tonsillectomy.   HTN has not taken medication Bp since sick elevated at this visit No problems updated.  Comprehensive ROS Pertinent positive and negative noted in HPI   Allergies[1]  Past Medical History:  Diagnosis Date   Chest pain    DM (diabetes mellitus) (HCC)    HLD (hyperlipidemia)    HTN (hypertension)    Hyperglycemia     Medications Ordered Prior to Encounter[2] Health Maintenance  Topic Date Due   Complete foot exam   Never done   Eye exam for diabetics  Never done   DTaP/Tdap/Td vaccine (1 - Tdap) Never done   Hepatitis B Vaccine (1 of 3 - 19+ 3-dose series) Never done   COVID-19 Vaccine (1 - 2025-26 season) Never done   Kidney health urinalysis for diabetes  11/05/2024   Hemoglobin A1C   11/05/2024   Flu Shot  02/22/2025*   Pneumococcal Vaccine (1 of 2 - PCV) 05/06/2025*   Yearly kidney function blood test for diabetes  05/06/2025   HPV Vaccine (No Doses Required) Completed   Hepatitis C Screening  Completed   HIV Screening  Completed   Meningitis B Vaccine  Aged Out  *Topic was postponed. The date shown is not the original due date.    Objective:   Vitals:   12/13/24 1136  BP: (!) 149/93  Pulse: 85  Resp: 16  SpO2: 95%  Weight: 250 lb 6.4 oz (113.6 kg)  Height: 5' 6 (1.676 m)     Physical Exam Vitals reviewed.  Constitutional:      Appearance: Normal appearance. He is obese.  HENT:     Head: Normocephalic.     Right Ear: Tympanic membrane and external ear normal.     Left Ear: Tympanic membrane and external ear normal.     Nose: Congestion and rhinorrhea present.  Eyes:     Extraocular Movements: Extraocular movements intact.     Pupils: Pupils are equal, round, and reactive to light.  Cardiovascular:     Rate and Rhythm: Normal rate and regular rhythm.  Pulmonary:     Effort: Pulmonary effort is normal.     Breath sounds: Wheezing present.  Abdominal:     General: Bowel sounds are normal. There is distension.     Palpations: Abdomen is soft.  Musculoskeletal:        General: Normal range of motion.  Skin:    General: Skin is warm and dry.  Neurological:     Mental Status: He is alert and oriented to person, place, and time.  Psychiatric:        Mood and Affect: Mood normal.        Behavior: Behavior normal.        Thought Content: Thought content normal.        Judgment: Judgment normal.     Assessment & Plan  Steve Nelson was seen today for sinus problem, diabetes and referral.  Diagnoses and all orders for this visit:  Essential hypertension BP goal - < 130/80 Explained that having normal blood pressure is the goal and medications are helping to get to goal and maintain normal blood pressure. DIET: Limit salt intake, read nutrition  labels to check salt content, limit fried and high fatty foods  Avoid using multisymptom OTC cold preparations that generally contain sudafed which can rise BP. Consult with pharmacist on best cold relief products to use for persons with HTN EXERCISE Discussed incorporating exercise such as walking - 30 minutes most days of the week and can do in 10 minute intervals    -     CBC with Differential/Platelet -     CMP14+EGFR  Mixed hyperlipidemia -     Lipid panel  Type 2 diabetes mellitus without complication, without long-term current use of insulin  (HCC) - educated on lifestyle modifications, including but not limited to diet choices and adding exercise to daily routine.   -     CMP14+EGFR -     Hemoglobin A1c -     Lipid panel -     Microalbumin / creatinine urine ratio  OSA (obstructive sleep apnea) -     Ambulatory referral to Pulmonology  Need for hepatitis B screening test -     Hepatitis B surface antibody,qualitative  Need for hepatitis C screening test  Acute cough -     dextromethorphan-guaiFENesin  (MUCINEX  DM) 30-600 MG 12hr tablet; Take 1 tablet by mouth 2 (two) times daily as needed for cough. Do not take over 5-7 days can increase your blood pressure.  Bronchospasm -     albuterol  (VENTOLIN  HFA) 108 (90 Base) MCG/ACT inhaler; Inhale 2 puffs into the lungs every 6 (six) hours as needed for wheezing or shortness of breath.    Patient have been counseled extensively about nutrition and exercise. Other issues discussed during this visit include: low cholesterol diet, weight control and daily exercise, foot care, annual eye examinations at Ophthalmology, importance of adherence with medications and regular follow-up. We also discussed long term complications of uncontrolled diabetes and hypertension.    The patient was given clear instructions to go to ER or return to medical center if symptoms don't improve, worsen or new problems develop. The patient verbalized  understanding. The patient was told to call to get lab results if they haven't heard anything in the next week.   This note has been created with Education officer, environmental. Any transcriptional errors are unintentional.   Steve SHAUNNA Bohr, NP 12/13/2024, 12:06 PM     [1] No Known Allergies [2]  Current Outpatient Medications on File Prior to Visit  Medication Sig Dispense Refill   Accu-Chek Softclix Lancets lancets Use to check blood sugar 3 times daily. 100 each 6   amLODipine  (NORVASC ) 10 MG tablet TAKE 1 TABLET(10 MG) BY MOUTH  DAILY 90 tablet 1   aspirin  EC 81 MG tablet Take 1 tablet (81 mg total) by mouth daily. Swallow whole. 90 tablet 1   atorvastatin  (LIPITOR) 20 MG tablet Take 1 tablet (20 mg total) by mouth daily. 90 tablet 3   Blood Glucose Monitoring Suppl (ACCU-CHEK GUIDE) w/Device KIT Use to check blood sugar 3 times daily. 1 kit 0   cetirizine  (ZYRTEC ) 10 MG tablet Take 1 tablet (10 mg total) by mouth daily. 90 tablet 1   Continuous Glucose Receiver (FREESTYLE LIBRE 3 READER) DEVI Use to check blood sugar continuously. 1 each 0   Continuous Glucose Sensor (FREESTYLE LIBRE 3 SENSOR) MISC Place 1 sensor on the skin every 14 days. Use to check glucose continuously 2 each 6   Dulaglutide  (TRULICITY ) 1.5 MG/0.5ML SOAJ Inject 1.5 mg into the skin once a week. 2 mL 3   empagliflozin  (JARDIANCE ) 10 MG TABS tablet Take 1 tablet (10 mg total) by mouth daily. 90 tablet 1   fluticasone  (FLONASE ) 50 MCG/ACT nasal spray Place 2 sprays into both nostrils daily. 16 g 2   glucose blood (ACCU-CHEK GUIDE TEST) test strip Use to check blood sugar 3 times daily. 100 each 6   insulin  glargine (LANTUS  SOLOSTAR) 100 UNIT/ML Solostar Pen Inject 10 Units into the skin daily. 15 mL 1   Insulin  Pen Needle (PEN NEEDLES) 32G X 4 MM MISC Use as instructed to inject insulin  once daily. 100 each 6   metFORMIN  (GLUCOPHAGE -XR) 500 MG 24 hr tablet TAKE 2 TABLETS(1000 MG) BY  MOUTH TWICE DAILY WITH A MEAL 360 tablet 0   metoprolol  succinate (TOPROL  XL) 25 MG 24 hr tablet Take 1 tablet (25 mg total) by mouth in the morning and at bedtime. 180 tablet 1   pantoprazole  (PROTONIX ) 40 MG tablet TAKE 1 TABLET(40 MG) BY MOUTH DAILY 90 tablet 1   valsartan -hydrochlorothiazide  (DIOVAN -HCT) 160-25 MG tablet TAKE 1 TABLET BY MOUTH DAILY 90 tablet 0   No current facility-administered medications on file prior to visit.   "

## 2024-12-15 ENCOUNTER — Encounter: Payer: Self-pay | Admitting: *Deleted

## 2024-12-15 LAB — CMP14+EGFR
ALT: 32 IU/L (ref 0–44)
AST: 23 IU/L (ref 0–40)
Albumin: 4.6 g/dL (ref 4.1–5.1)
Alkaline Phosphatase: 39 IU/L — ABNORMAL LOW (ref 47–123)
BUN/Creatinine Ratio: 7 — ABNORMAL LOW (ref 9–20)
BUN: 7 mg/dL (ref 6–24)
Bilirubin Total: 0.4 mg/dL (ref 0.0–1.2)
CO2: 20 mmol/L (ref 20–29)
Calcium: 9.6 mg/dL (ref 8.7–10.2)
Chloride: 104 mmol/L (ref 96–106)
Creatinine, Ser: 0.95 mg/dL (ref 0.76–1.27)
Globulin, Total: 2.7 g/dL (ref 1.5–4.5)
Glucose: 118 mg/dL — ABNORMAL HIGH (ref 70–99)
Potassium: 4.6 mmol/L (ref 3.5–5.2)
Sodium: 140 mmol/L (ref 134–144)
Total Protein: 7.3 g/dL (ref 6.0–8.5)
eGFR: 101 mL/min/1.73

## 2024-12-15 LAB — LIPID PANEL
Chol/HDL Ratio: 4.1 ratio (ref 0.0–5.0)
Cholesterol, Total: 168 mg/dL (ref 100–199)
HDL: 41 mg/dL
LDL Chol Calc (NIH): 99 mg/dL (ref 0–99)
Triglycerides: 161 mg/dL — ABNORMAL HIGH (ref 0–149)
VLDL Cholesterol Cal: 28 mg/dL (ref 5–40)

## 2024-12-15 LAB — CBC WITH DIFFERENTIAL/PLATELET
Basophils Absolute: 0.1 x10E3/uL (ref 0.0–0.2)
Basos: 1 %
EOS (ABSOLUTE): 0.3 x10E3/uL (ref 0.0–0.4)
Eos: 4 %
Hematocrit: 46.1 % (ref 37.5–51.0)
Hemoglobin: 15.2 g/dL (ref 13.0–17.7)
Immature Grans (Abs): 0 x10E3/uL (ref 0.0–0.1)
Immature Granulocytes: 0 %
Lymphocytes Absolute: 1.4 x10E3/uL (ref 0.7–3.1)
Lymphs: 21 %
MCH: 28.2 pg (ref 26.6–33.0)
MCHC: 33 g/dL (ref 31.5–35.7)
MCV: 86 fL (ref 79–97)
Monocytes Absolute: 0.7 x10E3/uL (ref 0.1–0.9)
Monocytes: 11 %
Neutrophils Absolute: 4 x10E3/uL (ref 1.4–7.0)
Neutrophils: 63 %
Platelets: 349 x10E3/uL (ref 150–450)
RBC: 5.39 x10E6/uL (ref 4.14–5.80)
RDW: 14.3 % (ref 11.6–15.4)
WBC: 6.4 x10E3/uL (ref 3.4–10.8)

## 2024-12-15 LAB — MICROALBUMIN / CREATININE URINE RATIO
Creatinine, Urine: 129.8 mg/dL
Microalb/Creat Ratio: 99 mg/g{creat} — ABNORMAL HIGH (ref 0–29)
Microalbumin, Urine: 128.1 ug/mL

## 2024-12-15 LAB — HEMOGLOBIN A1C
Est. average glucose Bld gHb Est-mCnc: 206 mg/dL
Hgb A1c MFr Bld: 8.8 % — ABNORMAL HIGH (ref 4.8–5.6)

## 2024-12-15 LAB — HEPATITIS B SURFACE ANTIBODY,QUALITATIVE: Hep B Surface Ab, Qual: NONREACTIVE

## 2024-12-16 ENCOUNTER — Ambulatory Visit: Attending: Emergency Medicine | Admitting: Emergency Medicine

## 2024-12-16 ENCOUNTER — Encounter: Payer: Self-pay | Admitting: Emergency Medicine

## 2024-12-16 VITALS — BP 130/82 | HR 99 | Ht 69.0 in | Wt 242.0 lb

## 2024-12-16 DIAGNOSIS — I428 Other cardiomyopathies: Secondary | ICD-10-CM | POA: Diagnosis not present

## 2024-12-16 DIAGNOSIS — R072 Precordial pain: Secondary | ICD-10-CM | POA: Diagnosis not present

## 2024-12-16 DIAGNOSIS — I10 Essential (primary) hypertension: Secondary | ICD-10-CM | POA: Diagnosis not present

## 2024-12-16 DIAGNOSIS — E1169 Type 2 diabetes mellitus with other specified complication: Secondary | ICD-10-CM | POA: Insufficient documentation

## 2024-12-16 DIAGNOSIS — I5020 Unspecified systolic (congestive) heart failure: Secondary | ICD-10-CM | POA: Diagnosis not present

## 2024-12-16 DIAGNOSIS — E119 Type 2 diabetes mellitus without complications: Secondary | ICD-10-CM | POA: Insufficient documentation

## 2024-12-16 DIAGNOSIS — E785 Hyperlipidemia, unspecified: Secondary | ICD-10-CM | POA: Diagnosis not present

## 2024-12-16 MED ORDER — METOPROLOL SUCCINATE ER 25 MG PO TB24: 25.0000 mg | ORAL_TABLET | Freq: Two times a day (BID) | ORAL | 3 refills | Status: AC

## 2024-12-16 MED ORDER — AMLODIPINE BESYLATE 10 MG PO TABS: 10.0000 mg | ORAL_TABLET | Freq: Every day | ORAL | 3 refills | Status: AC

## 2024-12-16 MED ORDER — ASPIRIN 81 MG PO TBEC: 81.0000 mg | DELAYED_RELEASE_TABLET | Freq: Every day | ORAL | 3 refills | Status: AC

## 2024-12-16 MED ORDER — EMPAGLIFLOZIN 10 MG PO TABS: 10.0000 mg | ORAL_TABLET | Freq: Every day | ORAL | 3 refills | Status: AC

## 2024-12-16 NOTE — Progress Notes (Signed)
 " Cardiology Office Note:    Date:  12/16/2024  ID:  Steve Nelson, DOB 11/18/1981, MRN [id number] PCP: Celestia Rosaline SQUIBB, NP  [facility] Providers Cardiologist:  Lurena MARLA Red, MD       Patient Profile:       Chief Complaint: 11-month follow-up History of Present Illness:  Steve Nelson is a 44 y.o. male with visit-pertinent history of nonischemic cardiomyopathy, T2DM, hypertension, hyperlipidemia, obesity, GERD, palpitations  Patient was initially seen by cardiology service in 10/2023 for post ED follow-up with chest pain.  During visit he endorsed occasional episodes of chest pain with associated palpitations.  Underwent coronary CTA on 10/2023 showing coronary calcium  score of 0.  Echocardiogram was completed 11/2023 that showed LVEF 40 to 45%, mild LVH, grade 1 DD and trivial MVR.  He was started on metoprolol  25 mg daily and Jardiance  10 mg daily.  Echocardiogram was repeated 3 months later on 01/2024 that showed LVEF 60 to 65%.  He was last seen in clinic on 06/01/2024 by [place], NP.  He was doing well at the time and remained euvolemic.  Heart rate was elevated at 109 bpm.  His metoprolol  was increased to 50 mg daily.  He was to follow-up in 6 months.   Discussed the use of AI scribe software for clinical note transcription with the patient, who gave verbal consent to proceed.  History of Present Illness Steve Nelson is a 44 year old male with hypertension and diabetes who presents for follow-up regarding his blood pressure and heart failure.  He has had an upper respiratory infection since the past weekend with rhinorrhea, cough, and dizziness. He uses an albuterol  inhaler for wheezing and a Mucinex  for cough.  Overall however he is without any acute cardiovascular concerns or complaints today.  He denies any chest pains or exertional symptoms.  He denies any heart failure symptoms and is without dyspnea, orthopnea, PND, LEE, weight gain.  His diabetes is poorly  controlled with a recent A1c of 8.8%. He stopped Trulicity  months ago after running out and is now on Jardiance , insulin , and metformin . He is unsure if his primary doctor knows he stopped Trulicity .   He has heavy snoring, gasping for breath during sleep per his girlfriend, and wakes unrefreshed, like a hangover. He was referred for a sleep study but has not yet been scheduled.  He works in holiday representative with regular physical activity.  He drinks tequila, about a fifth over the weekend and occasionally during the week. He quit smoking two years ago.   He denies syncope, presyncope, dizziness   Review of systems:  Please see the history of present illness. All other systems are reviewed and otherwise negative.       Studies Reviewed:    EKG Interpretation Date/Time:  Thursday December 16 2024 13:38:37 EST Ventricular Rate:  99 PR Interval:  144 QRS Duration:  84 QT Interval:  342 QTC Calculation: 438 R Axis:   -7  Text Interpretation: Normal sinus rhythm Possible Inferior infarct (cited on or before 07-Nov-2023) Anterior infarct , age undetermined When compared with ECG of 07-Nov-2023 10:09, T wave inversion no longer evident in Inferior leads Confirmed by Rana Dixon [phone]) on 12/16/2024 4:54:37 PM    Echocardiogram 02/19/2024 1. Left ventricular ejection fraction, by estimation, is 60 to 65%. Left  ventricular ejection fraction by 3D volume is 60 %. The left ventricle has  normal function. The left ventricle has no regional wall motion  abnormalities. Left ventricular diastolic  parameters were normal. The average left ventricular global longitudinal  strain is -22.2 %. The global longitudinal strain is normal.   2. Right ventricular systolic function is normal. The right ventricular  size is normal. Tricuspid regurgitation signal is inadequate for assessing  PA pressure.   3. The mitral valve is normal in structure. No evidence of mitral valve  regurgitation. No evidence  of mitral stenosis.   4. The aortic valve is tricuspid. Aortic valve regurgitation is not  visualized. No aortic stenosis is present.   5. The inferior vena cava is normal in size with greater than 50%  respiratory variability, suggesting right atrial pressure of 3 mmHg.   Echocardiogram 12/18/2023 1. Left ventricular ejection fraction, by estimation, is 40 to 45%. Left  ventricular ejection fraction by 2D MOD biplane is 42.6 %. The left  ventricle has mildly decreased function. The left ventricle demonstrates  global hypokinesis. There is mild left  ventricular hypertrophy. Left ventricular diastolic parameters are  consistent with Grade I diastolic dysfunction (impaired relaxation).   2. Right ventricular systolic function is normal. The right ventricular  size is normal.   3. The mitral valve is grossly normal. Trivial mitral valve  regurgitation.   4. The aortic valve is tricuspid. Aortic valve regurgitation is not  visualized.   Coronary CTA 11/20/2023 1.  Total coronary calcium  score of 0.   2. Total plaque volume (TPV) 0 mm3.   3. Normal coronary origin with right dominance.   4. Small patent foramen ovale cannot be rule out, consider limited echocardiogram with bubble study if clinically warranted.   5. CAD-RADS = 0 No evidence of CAD . Risk Assessment/Calculations:              Physical Exam:   VS:  BP 130/82 (BP Location: Left Arm, Patient Position: Sitting, Cuff Size: Large)   Pulse 99   Ht 5' 9 (1.753 m)   Wt 242 lb (109.8 kg)   BMI 35.74 kg/m    Wt Readings from Last 3 Encounters:  12/16/24 242 lb (109.8 kg)  12/13/24 250 lb 6.4 oz (113.6 kg)  06/14/24 240 lb 12.8 oz (109.2 kg)    GEN: Well nourished, well developed in no acute distress NECK: No JVD; No carotid bruits CARDIAC: RRR, no murmurs, rubs, gallops RESPIRATORY:  Clear to auscultation without rales, wheezing or rhonchi  ABDOMEN: Soft, non-tender, non-distended EXTREMITIES:  No edema; No acute  deformity      Assessment and Plan:  Chest pain Coronary CTA 10/2023 with coronary calcium  score of 0 and no evidence of CAD - Today he denies chest pains and is without anginal symptoms  - No indication for further ischemic evaluation at this time  HFimpEF NICM Echocardiogram 11/2023 with LVEF 40 to 45% Repeat echocardiogram 01/2024 with LVEF 60 to 65%, no RWMA, normal diastolic parameters, RV normal Coronary CTA 10/2023 but no evidence of CAD - Currently drinking a fifth of tequila every weekend  - Today he appears euvolemic and well compensated on exam.  He denies dyspnea, orthopnea, PND, weight gain, LEE - No changes to current GDMT - Continue Jardiance  10 mg daily, metoprolol  succinate 50 mg daily, and valsartan -hydrochlorothiazide  160-25 mg daily - We discussed decreasing his alcohol intake  Hypertension Blood pressure today is well-controlled at 130/82 - Continue amlodipine  10 mg daily, metoprolol  succinate 50 mg daily, and valsartan -hydrochlorothiazide  160-25 mg daily  Hyperlipidemia LDL 99 on 11/2024 and controlled - Continue atorvastatin  20 mg daily  T2DM Obesity A1c 8.8%  on 11/2024 and uncontrolled BMI 35.74 - Currently managed on Jardiance , insulin  glargine, and metformin  - Was previously on Trulicity  however he stopped this several months ago after he ran out of refills - I have asked him to-speak to his PCP about resuming Trulicity  - Heart healthy dieting and daily aerobic exercise encouraged  Suspected obstructive sleep apnea Symptoms include snoring and daytime somnolence - Has been referred to pulmonology for sleep study       Dispo:  Return in about 6 months (around 06/15/2025).  Signed, Lum LITTIE Louis, NP  "

## 2024-12-16 NOTE — Patient Instructions (Signed)
 Medication Instructions:  NO CHANGES  Lab Work: NONE TO BE DONE TODAY.  Testing/Procedures: NONE   Follow-Up: At [facility], you and your health needs are our priority.  As part of our continuing mission to provide you with exceptional heart care, our providers are all part of one team.  This team includes your primary Cardiologist (physician) and Advanced Practice Providers or APPs (Physician Assistants and Nurse Practitioners) who all work together to provide you with the care you need, when you need it.  Your next appointment:   6 MONTHS  Provider:   MADISON FOUNTAIN, DNP   We recommend signing up for the patient portal called MyChart.  Sign up information is provided on this After Visit Summary.  MyChart is used to connect with patients for Virtual Visits (Telemedicine).  Patients are able to view lab/test results, encounter notes, upcoming appointments, etc.  Non-urgent messages can be sent to your provider as well.   To learn more about what you can do with MyChart, go to [email or web address].   Other Instructions:  [facility] at [facility]  Address: [address], [place], [place] [address] Phone: [phone]

## 2024-12-19 ENCOUNTER — Ambulatory Visit (INDEPENDENT_AMBULATORY_CARE_PROVIDER_SITE_OTHER): Payer: Self-pay | Admitting: Primary Care

## 2024-12-22 ENCOUNTER — Encounter (HOSPITAL_BASED_OUTPATIENT_CLINIC_OR_DEPARTMENT_OTHER): Payer: Self-pay

## 2024-12-22 ENCOUNTER — Ambulatory Visit (INDEPENDENT_AMBULATORY_CARE_PROVIDER_SITE_OTHER)

## 2024-12-22 VITALS — BP 125/81 | HR 104 | Ht 69.0 in | Wt 245.0 lb

## 2024-12-22 DIAGNOSIS — G4719 Other hypersomnia: Secondary | ICD-10-CM

## 2024-12-22 DIAGNOSIS — G4710 Hypersomnia, unspecified: Secondary | ICD-10-CM

## 2024-12-22 DIAGNOSIS — R0683 Snoring: Secondary | ICD-10-CM | POA: Diagnosis not present

## 2024-12-22 DIAGNOSIS — Z87891 Personal history of nicotine dependence: Secondary | ICD-10-CM

## 2024-12-22 DIAGNOSIS — R0602 Shortness of breath: Secondary | ICD-10-CM | POA: Diagnosis not present

## 2024-12-22 NOTE — Patient Instructions (Signed)
 Complete home sleep test and PFT; follow up after these are done.    Don't take Albuterol  on the morning of the lung function test.

## 2024-12-22 NOTE — Progress Notes (Signed)
 "  @Patient  ID: Steve Nelson, male    DOB: 11-02-1981, 44 y.o.   MRN: [id number]  Chief Complaint  Patient presents with   Establish Care    New sleep     Referring provider: Celestia Rosaline SQUIBB, NP  HPI: Discussed the use of AI scribe software for clinical note transcription with the patient, who gave verbal consent to proceed.  History of Present Illness Steve Nelson is a 44 year old male with PMH of diabetes and hypertension who presents with excessive daytime sleepiness and snoring. He was referred by his primary care physician for evaluation of sleep-related issues.  He experiences excessive daytime sleepiness, often falling asleep during the day despite a full night's rest. His girlfriend has noted loud snoring and gasping for air during sleep, symptoms present for a couple of years. His sleep schedule involves going to bed between 8 and 9 PM and waking up around 5 AM, yet he feels restless and can easily fall back asleep. He has gained about 50 pounds over the past two years.  He has a history of diabetes, hypertension, and high cholesterol, for which he takes medication. He uses Flonase  for nasal allergies and an albuterol  inhaler prescribed recently due to wheezing after a cold. He stopped smoking two years ago after smoking half a pack a day for nearly 20 years.  He drinks alcohol primarily on weekends, consuming about a fifth of alcohol from Friday to Sunday. He acknowledges that his snoring and breathing issues worsen with alcohol consumption.  No history of asthma, COPD, or other chronic lung diseases. He is active at work, walking daily, and does not experience shortness of breath during physical activities, although he recently experienced shortness of breath while recovering from a cold.  TEST/EVENTS :   ECHO 02/19/2024:  EF 60-65%; no regional wall motion abnormalities.  RAP estimated at 3 mm Hg  Allergies[1]  Immunization History  Administered Date(s)  Administered   Influenza,inj,Quad PF,6+ Mos 09/13/2019    Past Medical History:  Diagnosis Date   Chest pain    DM (diabetes mellitus) (HCC)    HLD (hyperlipidemia)    HTN (hypertension)    Hyperglycemia     Tobacco History: Tobacco Use History[2] Counseling given: Not Answered   Outpatient Medications Prior to Visit  Medication Sig Dispense Refill   Accu-Chek Softclix Lancets lancets Use to check blood sugar 3 times daily. 100 each 6   albuterol  (VENTOLIN  HFA) 108 (90 Base) MCG/ACT inhaler Inhale 2 puffs into the lungs every 6 (six) hours as needed for wheezing or shortness of breath. 8 g 2   amLODipine  (NORVASC ) 10 MG tablet Take 1 tablet (10 mg total) by mouth daily. 90 tablet 3   aspirin  EC 81 MG tablet Take 1 tablet (81 mg total) by mouth daily. Swallow whole. 90 tablet 3   atorvastatin  (LIPITOR) 20 MG tablet Take 1 tablet (20 mg total) by mouth daily. 90 tablet 3   Blood Glucose Monitoring Suppl (ACCU-CHEK GUIDE) w/Device KIT Use to check blood sugar 3 times daily. 1 kit 0   cetirizine  (ZYRTEC ) 10 MG tablet Take 1 tablet (10 mg total) by mouth daily. 90 tablet 1   Continuous Glucose Receiver (FREESTYLE LIBRE 3 READER) DEVI Use to check blood sugar continuously. 1 each 0   Continuous Glucose Sensor (FREESTYLE LIBRE 3 SENSOR) MISC Place 1 sensor on the skin every 14 days. Use to check glucose continuously 2 each 6   dextromethorphan-guaiFENesin  (MUCINEX  DM) 30-600 MG 12hr tablet  Take 1 tablet by mouth 2 (two) times daily as needed for cough. Do not take over 5-7 days can increase your blood pressure. 30 tablet 1   Dulaglutide  (TRULICITY ) 1.5 MG/0.5ML SOAJ Inject 1.5 mg into the skin once a week. 2 mL 3   empagliflozin  (JARDIANCE ) 10 MG TABS tablet Take 1 tablet (10 mg total) by mouth daily. 90 tablet 3   fluticasone  (FLONASE ) 50 MCG/ACT nasal spray Place 2 sprays into both nostrils daily. 16 g 2   glucose blood (ACCU-CHEK GUIDE TEST) test strip Use to check blood sugar 3 times  daily. 100 each 6   insulin  glargine (LANTUS  SOLOSTAR) 100 UNIT/ML Solostar Pen Inject 10 Units into the skin daily. 15 mL 1   Insulin  Pen Needle (PEN NEEDLES) 32G X 4 MM MISC Use as instructed to inject insulin  once daily. 100 each 6   metFORMIN  (GLUCOPHAGE -XR) 500 MG 24 hr tablet TAKE 2 TABLETS(1000 MG) BY MOUTH TWICE DAILY WITH A MEAL 360 tablet 0   metoprolol  succinate (TOPROL  XL) 25 MG 24 hr tablet Take 1 tablet (25 mg total) by mouth in the morning and at bedtime. 180 tablet 3   pantoprazole  (PROTONIX ) 40 MG tablet TAKE 1 TABLET(40 MG) BY MOUTH DAILY 90 tablet 1   valsartan -hydrochlorothiazide  (DIOVAN -HCT) 160-25 MG tablet TAKE 1 TABLET BY MOUTH DAILY 90 tablet 0   No facility-administered medications prior to visit.     Review of Systems: as per hpi  Constitutional:   No  weight loss, night sweats,  Fevers, chills, fatigue, or  lassitude.  HEENT:   No headaches,  Difficulty swallowing,  Tooth/dental problems, or  Sore throat,                No sneezing, itching, ear ache, nasal congestion, post nasal drip,   CV:  No chest pain,  Orthopnea, PND, swelling in lower extremities, anasarca, dizziness, palpitations, syncope.   GI  No heartburn, indigestion, abdominal pain, nausea, vomiting, diarrhea, change in bowel habits, loss of appetite, bloody stools.   Resp: No shortness of breath with exertion or at rest.  No excess mucus, no productive cough,  No non-productive cough,  No coughing up of blood.  No change in color of mucus.  No wheezing.  No chest wall deformity  Skin: no rash or lesions.  GU: no dysuria, change in color of urine, no urgency or frequency.  No flank pain, no hematuria   MS:  No joint pain or swelling.  No decreased range of motion.  No back pain.    Physical Exam  BP 125/81 (BP Location: Left Arm)   Pulse (!) 104   Ht 5' 9 (1.753 m)   Wt 245 lb (111.1 kg)   SpO2 93%   BMI 36.18 kg/m   GEN: A/Ox3; pleasant , NAD, well nourished    HEENT:  [place]/AT,   EACs-clear, TMs-wnl, NOSE-clear, THROAT-clear, no lesions, no postnasal drip or exudate noted. Mallampati 4  NECK:  Supple w/ fair ROM; no JVD; normal carotid impulses w/o bruits; no thyromegaly or nodules palpated; no lymphadenopathy.    RESP  Clear  P & A; w/o, wheezes/ rales/ or rhonchi. no accessory muscle use, no dullness to percussion  CARD:  RRR, no m/r/g, no peripheral edema, pulses intact, no cyanosis or clubbing.  GI:   obese, soft & nt; nml bowel sounds; no organomegaly or masses detected.   Musco: Warm bil, no deformities or joint swelling noted.   Neuro: alert, no focal deficits noted.  Skin: Warm, no lesions or rashes    Lab Results:  CBC    Component Value Date/Time   WBC 6.4 12/13/2024 1215   WBC 7.4 10/27/2023 1418   RBC 5.39 12/13/2024 1215   RBC 5.25 10/27/2023 1418   HGB 15.2 12/13/2024 1215   HCT 46.1 12/13/2024 1215   PLT 349 12/13/2024 1215   MCV 86 12/13/2024 1215   MCH 28.2 12/13/2024 1215   MCH 29.1 10/27/2023 1418   MCHC 33.0 12/13/2024 1215   MCHC 34.8 10/27/2023 1418   RDW 14.3 12/13/2024 1215   LYMPHSABS 1.4 12/13/2024 1215   MONOABS 0.6 10/27/2023 1418   EOSABS 0.3 12/13/2024 1215   BASOSABS 0.1 12/13/2024 1215    BMET    Component Value Date/Time   NA 140 12/13/2024 1215   K 4.6 12/13/2024 1215   CL 104 12/13/2024 1215   CO2 20 12/13/2024 1215   GLUCOSE 118 (H) 12/13/2024 1215   GLUCOSE 466 (H) 10/27/2023 1418   BUN 7 12/13/2024 1215   CREATININE 0.95 12/13/2024 1215   CALCIUM  9.6 12/13/2024 1215   GFRNONAA >60 10/27/2023 1418   GFRAA 116 10/18/2019 1054    BNP No results found for: BNP  ProBNP No results found for: PROBNP  Imaging: No results found.  Administration History     None           No data to display          No results found for: NITRICOXIDE   Assessment & Plan:   Assessment & Plan Excessive daytime sleepiness  Shortness of breath  Former tobacco use  Assessment and  Plan Assessment & Plan Suspected obstructive sleep apnea Chronic snoring, daytime sleepiness, and observed apneas suggest obstructive sleep apnea. Elevated Epworth Sleepiness Scale score of 17 indicates significant daytime sleepiness. Alcohol consumption may exacerbate symptoms. Discussed that untreated sleep apnea can lead to cardiovascular complications due to increased cardiac workload during sleep. - Ordered home sleep study to confirm diagnosis and assess severity. - Will discuss potential treatment options based on sleep study results, including CPAP therapy if indicated.  Evaluation of shortness of breath Intermittent shortness of breath, particularly after recent upper respiratory infection. Smoking history and recent wheezing suggest possible underlying pulmonary issues. - Ordered lung function test to assess pulmonary function. - Instructed to withhold inhaler use on the morning of the lung function test.    Return in about 6 weeks (around 02/02/2025) for sleep study review, PFT.  Candis Dandy, PA-C 12/22/2024      [1] Not on File [2]  Social History Tobacco Use  Smoking Status Former   Current packs/day: 0.00   Types: Cigarettes   Quit date: 07/23/2023   Years since quitting: 1.4  Smokeless Tobacco Never   "

## 2024-12-22 NOTE — Progress Notes (Signed)
 Epworth Sleepiness Scale  Use the following scale to choose the most appropriate number for each situation. 0 Would never nod off 1  Slight  chance of nodding off 2 Moderate chance of nodding off 3 High chance of nodding off  Sitting and reading: 3 Watching TV: 3  Sitting, inactive, in a public place (e.g., in a meeting, theater, or dinner event): 3 As a passenger in a car for an hour or more without stopping for a break: 2 Lying down to rest when circumstances permit:3 Sitting and talking to someone: 0 Sitting quietly after a meal without alcohol: 0 In a car, while stopped for a few 0 minutes in traffic or at a light: 0  TOTOAL: 17

## 2024-12-27 ENCOUNTER — Encounter (HOSPITAL_BASED_OUTPATIENT_CLINIC_OR_DEPARTMENT_OTHER)

## 2024-12-30 ENCOUNTER — Encounter (HOSPITAL_BASED_OUTPATIENT_CLINIC_OR_DEPARTMENT_OTHER)

## 2024-12-30 DIAGNOSIS — G4719 Other hypersomnia: Secondary | ICD-10-CM

## 2025-01-27 ENCOUNTER — Ambulatory Visit (INDEPENDENT_AMBULATORY_CARE_PROVIDER_SITE_OTHER): Payer: Self-pay | Admitting: Primary Care

## 2025-02-07 ENCOUNTER — Encounter (HOSPITAL_BASED_OUTPATIENT_CLINIC_OR_DEPARTMENT_OTHER)

## 2025-02-07 ENCOUNTER — Ambulatory Visit (HOSPITAL_BASED_OUTPATIENT_CLINIC_OR_DEPARTMENT_OTHER)
